# Patient Record
Sex: Female | Born: 1965 | Race: White | Hispanic: No | Marital: Married | Smoking: Light tobacco smoker
Health system: Southern US, Community
[De-identification: ages and names within clinical notes are randomized; demographics above are authoritative.]

## PROBLEM LIST (undated history)

## (undated) DIAGNOSIS — M5030 Other cervical disc degeneration, unspecified cervical region: Secondary | ICD-10-CM

## (undated) DIAGNOSIS — T7840XA Allergy, unspecified, initial encounter: Secondary | ICD-10-CM

## (undated) DIAGNOSIS — E28319 Asymptomatic premature menopause: Secondary | ICD-10-CM

## (undated) HISTORY — PX: ABLATION: SHX5711

## (undated) HISTORY — PX: TUBAL LIGATION: SHX77

## (undated) HISTORY — DX: Asymptomatic premature menopause: E28.319

## (undated) HISTORY — DX: Other cervical disc degeneration, unspecified cervical region: M50.30

## (undated) HISTORY — PX: CERVICAL DISC SURGERY: SHX588

## (undated) HISTORY — PX: LUMBAR DISC SURGERY: SHX700

---

## 1998-05-07 ENCOUNTER — Other Ambulatory Visit: Admission: RE | Admit: 1998-05-07 | Discharge: 1998-05-07 | Payer: Self-pay | Admitting: Obstetrics and Gynecology

## 1998-07-22 ENCOUNTER — Inpatient Hospital Stay (HOSPITAL_COMMUNITY): Admission: AD | Admit: 1998-07-22 | Discharge: 1998-07-22 | Payer: Self-pay | Admitting: Obstetrics and Gynecology

## 1998-07-25 ENCOUNTER — Inpatient Hospital Stay (HOSPITAL_COMMUNITY): Admission: AD | Admit: 1998-07-25 | Discharge: 1998-07-25 | Payer: Self-pay | Admitting: Obstetrics and Gynecology

## 1998-10-07 ENCOUNTER — Inpatient Hospital Stay (HOSPITAL_COMMUNITY): Admission: AD | Admit: 1998-10-07 | Discharge: 1998-10-09 | Payer: Self-pay | Admitting: Obstetrics and Gynecology

## 1998-10-22 ENCOUNTER — Observation Stay (HOSPITAL_COMMUNITY): Admission: AD | Admit: 1998-10-22 | Discharge: 1998-10-23 | Payer: Self-pay | Admitting: Obstetrics and Gynecology

## 1998-11-02 ENCOUNTER — Inpatient Hospital Stay (HOSPITAL_COMMUNITY): Admission: AD | Admit: 1998-11-02 | Discharge: 1998-11-04 | Payer: Self-pay | Admitting: Obstetrics and Gynecology

## 1998-12-16 ENCOUNTER — Other Ambulatory Visit: Admission: RE | Admit: 1998-12-16 | Discharge: 1998-12-16 | Payer: Self-pay | Admitting: Obstetrics and Gynecology

## 1999-12-24 ENCOUNTER — Other Ambulatory Visit: Admission: RE | Admit: 1999-12-24 | Discharge: 1999-12-24 | Payer: Self-pay | Admitting: Obstetrics and Gynecology

## 2000-06-18 ENCOUNTER — Inpatient Hospital Stay (HOSPITAL_COMMUNITY): Admission: AD | Admit: 2000-06-18 | Discharge: 2000-06-18 | Payer: Self-pay | Admitting: *Deleted

## 2000-08-12 ENCOUNTER — Inpatient Hospital Stay (HOSPITAL_COMMUNITY): Admission: AD | Admit: 2000-08-12 | Discharge: 2000-08-15 | Payer: Self-pay | Admitting: Obstetrics and Gynecology

## 2000-08-16 ENCOUNTER — Encounter: Admission: RE | Admit: 2000-08-16 | Discharge: 2000-09-27 | Payer: Self-pay | Admitting: Obstetrics and Gynecology

## 2000-09-27 ENCOUNTER — Other Ambulatory Visit: Admission: RE | Admit: 2000-09-27 | Discharge: 2000-09-27 | Payer: Self-pay | Admitting: Obstetrics and Gynecology

## 2001-10-17 ENCOUNTER — Other Ambulatory Visit: Admission: RE | Admit: 2001-10-17 | Discharge: 2001-10-17 | Payer: Self-pay | Admitting: Obstetrics and Gynecology

## 2002-12-11 ENCOUNTER — Other Ambulatory Visit: Admission: RE | Admit: 2002-12-11 | Discharge: 2002-12-11 | Payer: Self-pay | Admitting: Obstetrics and Gynecology

## 2004-02-18 ENCOUNTER — Other Ambulatory Visit: Admission: RE | Admit: 2004-02-18 | Discharge: 2004-02-18 | Payer: Self-pay | Admitting: Obstetrics and Gynecology

## 2005-04-27 ENCOUNTER — Other Ambulatory Visit: Admission: RE | Admit: 2005-04-27 | Discharge: 2005-04-27 | Payer: Self-pay | Admitting: Obstetrics and Gynecology

## 2006-02-24 ENCOUNTER — Ambulatory Visit: Payer: Self-pay | Admitting: Orthopedic Surgery

## 2006-03-05 ENCOUNTER — Encounter: Admission: RE | Admit: 2006-03-05 | Discharge: 2006-03-05 | Payer: Self-pay | Admitting: Orthopedic Surgery

## 2006-03-17 ENCOUNTER — Ambulatory Visit: Payer: Self-pay | Admitting: Orthopedic Surgery

## 2006-04-28 ENCOUNTER — Ambulatory Visit (HOSPITAL_COMMUNITY): Admission: RE | Admit: 2006-04-28 | Discharge: 2006-04-29 | Payer: Self-pay | Admitting: Neurosurgery

## 2006-11-20 ENCOUNTER — Encounter: Admission: RE | Admit: 2006-11-20 | Discharge: 2006-11-20 | Payer: Self-pay | Admitting: Neurosurgery

## 2007-09-27 DIAGNOSIS — H902 Conductive hearing loss, unspecified: Secondary | ICD-10-CM | POA: Insufficient documentation

## 2007-10-11 ENCOUNTER — Emergency Department (HOSPITAL_COMMUNITY): Admission: EM | Admit: 2007-10-11 | Discharge: 2007-10-12 | Payer: Self-pay | Admitting: Emergency Medicine

## 2008-04-16 ENCOUNTER — Ambulatory Visit (HOSPITAL_COMMUNITY): Admission: RE | Admit: 2008-04-16 | Discharge: 2008-04-17 | Payer: Self-pay | Admitting: Neurosurgery

## 2008-09-04 ENCOUNTER — Ambulatory Visit (HOSPITAL_COMMUNITY): Admission: RE | Admit: 2008-09-04 | Discharge: 2008-09-04 | Payer: Self-pay | Admitting: Neurosurgery

## 2009-06-06 ENCOUNTER — Ambulatory Visit (HOSPITAL_COMMUNITY): Admission: RE | Admit: 2009-06-06 | Discharge: 2009-06-06 | Payer: Self-pay | Admitting: Obstetrics and Gynecology

## 2011-02-21 LAB — PREGNANCY, URINE: Preg Test, Ur: NEGATIVE

## 2011-02-21 LAB — CBC
HCT: 34.5 % — ABNORMAL LOW (ref 36.0–46.0)
RBC: 3.69 MIL/uL — ABNORMAL LOW (ref 3.87–5.11)
WBC: 6.1 10*3/uL (ref 4.0–10.5)

## 2011-03-30 NOTE — Op Note (Signed)
NAMETAJAE, MAIOLO                ACCOUNT NO.:  [id number]   MEDICAL RECORD NO.:  [id number]          PATIENT TYPE:  OIB   LOCATION:  3536                         FACILITY:  [facility]   PHYSICIAN:  Danae Orleans. Venetia Maxon, M.D.  DATE OF BIRTH:  Oct 03, 1966   DATE OF PROCEDURE:  04/16/2008  DATE OF DISCHARGE:                               OPERATIVE REPORT   PREOPERATIVE DIAGNOSES:  Herniated cervical disk, C6-C7, with  degenerative disk disease, spondylosis, and radiculopathy.   POSTOPERATIVE DIAGNOSES:  Herniated cervical disk, C6-C7, with  degenerative disk disease, spondylosis, and radiculopathy.   PROCEDURE:  Anterior cervical decompression and fusion, C6-C7 with  allograft, bone graft, morselized bone autograft, and anterior cervical  plate.   SURGEON:  Dorian Heckle, MD   ASSISTANTS:  Cristi Loron, MD and Georgiann Cocker, RN   ANESTHESIA:  General endotracheal anesthesia.   ESTIMATED BLOOD LOSS:  Minimal.   COMPLICATIONS:  None.   DISPOSITION:  Recovery.   INDICATIONS:  Lori [name] is a 45 year old woman who previously had  undergone anterior cervical decompression and fusion at C7-T1 level.  She developed cervical disk degeneration and disk herniation C6-C7.  It  was elected to take her to the surgery for anterior cervical  decompression and fusion at this affected level.   PROCEDURE IN DETAIL:  Lori [name] was brought to the operating room.  Following satisfactory and uncomplicated induction of general  endotracheal anesthesia and placement of intravenous lines, the patient  was placed in the supine position on the operating table.  Her neck was  placed in slight extension.  She was placed in 5 pounds of Halter  traction.  Her anterior neck was then prepped and draped in usual  sterile fashion.  The area of planned incision was re-infiltrated with  0.25% Marcaine, 0.5% lidocaine with 1:200,000 epinephrine.  Incision was  made from the midline to the anterior border of  sternocleidomastoid  muscle and carried sharply through platysma layer on the left side of  midline.  Sub platysmal dissection was performed exposing the anterior  cervical spine.  Previously placed plate at the Z6-X0 was identified and  the C6-C7 interspace was identified.  The longus colli muscles was taken  down from the anterior cervical spine from C6-C7 bilaterally using  electrocautery and Key elevator.  Self-retaining shadow line retractor  was placed to facilitate exposure.  A thorough diskectomy was performed  at C6-C7 and endplates were decorticated with high-speed drill and bone  removed through these maneuvers was saved for later use in bone  grafting.  The posterior longitudinal ligament was then removed in  piecemeal fashion decompressing a cervical spinal cord dura both C7  nerve roots, particularly on the left where a large amount of disk  material was removed with decompression of the C7 nerve root widely as  they extended out of the neural foramen.  Hemostasis was assured with  Gelfoam-soaked thrombin.  After trial sizing, a small Alphatec bone  allograft wedge 7 mm in thickness was then fashioned with a high-speed  drill, packed with morselized bone autograft, inserted in the  interspace, and countersunk appropriately.  Using Trestle anterior  cervical plate, this was abutted against the previously placed plate and  variable angle of 40 mm screws were placed, 2 at C7 and 2 at C6.  All  screws had excellent purchase and locking mechanisms were engaged.  Final x-ray demonstrated well-positioned interbody graft in the anterior  cervical plate.  The wound was irrigated.  Soft tissues were inspected  and found to be good repair.  Hemostasis was assured.  The platysma  layer was closed with 3-0 Vicryl sutures and skin edges were  approximated 3-0 Vicryl subcuticular stitch.  The wound was dressed with  Dermabond.  The patient was extubated in the operating room and taken to   recovery in stable satisfactory condition.  He tolerated the operation  well.  Counts were correct at the end of the case.      Danae Orleans. Venetia Maxon, M.D.  Electronically Signed     JDS/MEDQ  D:  04/16/2008  T:  04/17/2008  Job:  [id number]

## 2011-03-30 NOTE — H&P (Signed)
Lori [name], Lori [name]                ACCOUNT NO.:  [id number]   MEDICAL RECORD NO.:  [id number]          PATIENT TYPE:  AMB   LOCATION:  SDC                           [facility]   PHYSICIAN:  Duke Salvia. Marcelle Overlie, M.D.DATE OF BIRTH:  [birth year]   DATE OF ADMISSION:  DATE OF DISCHARGE:                              HISTORY & PHYSICAL   CHIEF COMPLAINT:  For tubal ligation.   HISTORY OF PRESENT ILLNESS:  A 46 year old G5, P3 has an 53, 83 and 54-  year-old who has been using oral contraceptives presented to the office  this week for attempted Essure procedure.  The insert could not be  inserted into either tube due to either tubal spasm or blockage.  She is  sure she would not want to be pregnant again under any circumstances and  opts for Filshie clip tubal.  This procedure including risks related to  bleeding, infection, other complications that may require additional or  open surgery discussed.  The permanence of the procedure, failure rate  of two to three per thousand all reviewed with her which she understands  and accepts.  She was advised to continue using birth control pills one  additional month beyond today.   PAST MEDICAL HISTORY:  Allergies none.   CURRENT MEDICATIONS:  Cymbalta and oral contraceptives.   SURGICAL HISTORY:  Three vaginal deliveries.  She has had two cervical  diskectomies and a D and C in 1998.   SOCIAL HISTORY:  Denies alcohol, tobacco and drug use.  She is married.  Dr. Herb Grays is her medical doctor.   PHYSICAL EXAMINATION:  Temperature 93, blood pressure 120/78.  HEENT: Unremarkable.  NECK:  Supple without masses.  LUNGS:  Clear.  CARDIOVASCULAR:  Regular rate and rhythm without murmurs, rubs, gallops  noted.  BREASTS:  Without masses.  ABDOMEN:  Soft, flat, nontender.  PELVIC:  Normal external genitalia.  Vagina and cervix are clear.  Uterus mid positional, normal size.  Adnexa negative.  EXTREMITIES:  Unremarkable.  NEUROLOGIC:   Unremarkable.   IMPRESSION:  Failed Essure tubal ligation.   PLAN:  Filshie clip tubal ligation procedure and risks reviewed as  above.      Richard M. Marcelle Overlie, M.D.  Electronically Signed     RMH/MEDQ  D:  06/06/2009  T:  06/06/2009  Job:  [id number]

## 2011-03-30 NOTE — Op Note (Signed)
NAMEJOZI, Lori [name]                ACCOUNT NO.:  [id number]   MEDICAL RECORD NO.:  [id number]          PATIENT TYPE:  AMB   LOCATION:  SDC                           [facility]   PHYSICIAN:  Duke Salvia. Marcelle Overlie, M.D.DATE OF BIRTH:  1966/05/28   DATE OF PROCEDURE:  06/06/2009  DATE OF DISCHARGE:  06/06/2009                               OPERATIVE REPORT   PREOPERATIVE DIAGNOSIS:  Requests permanent sterilization.   POSTOPERATIVE DIAGNOSIS:  Requests permanent sterilization.   PROCEDURE:  Laparoscopic tubal with Filshie clips.   SURGEON:  Duke Salvia. Marcelle Overlie, MD   ANESTHESIA:  General.   COMPLICATIONS:  None.   DRAINS:  In and out Foley catheter.   BLOOD LOSS:  Minimal.   COMPLICATIONS:  None.   PROCEDURE AND FINDINGS:  The patient was taken to the room after an  adequate level of general endotracheal anesthesia was obtained with the  patient's legs in stirrups.  The abdomen, perineum, and vagina were  prepped and draped in the usual manner for laparoscopy.  The bladder was  drained.  The EUA carried out, uterus was mid to posterior, normal size,  adnexa negative.  Hulka tenaculum was positioned.  Attention directed to  the abdomen.  The subumbilical area was infiltrated with 0.25% Marcaine  plain.  A small incision was made, the Veress needle was introduced  without difficulty.  Its intra-abdominal position verified by pressure  and water testing.  After 2.5 L of pneumoperitoneum was then created,  laparoscopic trocar and sleeve were then introduced without difficulty.  The patient was placed in Trendelenburg.  The pelvic findings as  follows.   Uterus itself was mid-to-posterior in position, but on elevation, it was  normal.  The cul-de-sac and anterior space were free and clear.  Both  tubes and ovaries were otherwise unremarkable.  A 5 mL of 4% Marcaine  was then dripped across each tube and the cornu to the fimbriated and  the Filshie clip applicator was then back  loaded.  The right tube was  identified and traced from cornu to the fimbriated end, regressed 2-3 cm  from the cornu with a Filshie clip applied at the right angle completely  engulfing the tube.  This showed excellent application.  The exact same  repeated on the left after carefully identifying the tube from cornu to  the fimbriated end.  The area was then video documented.  Instruments  were removed, gas allowed to escape.  Defects were closed with 4 Dexon  subcuticular suture.  She tolerated this well, went to recovery room in  good condition.      Richard M. Marcelle Overlie, M.D.  Electronically Signed     RMH/MEDQ  D:  06/06/2009  T:  06/07/2009  Job:  [id number]

## 2011-04-02 NOTE — Op Note (Signed)
Lori [name], [name]                ACCOUNT NO.:  [id number]   MEDICAL RECORD NO.:  [id number]          PATIENT TYPE:  AMB   LOCATION:  SDS                          FACILITY:  [facility]   PHYSICIAN:  Danae Orleans. Venetia Maxon, M.D.  DATE OF BIRTH:  1965-12-02   DATE OF PROCEDURE:  04/28/2006  DATE OF DISCHARGE:                                 OPERATIVE REPORT   PREOPERATIVE DIAGNOSIS:  Herniated cervical disc with spondylosis,  degenerative disc disease and radiculopathy, C7-T1 level.   POSTOPERATIVE DIAGNOSIS:  Herniated cervical disc with spondylosis,  degenerative disc disease and radiculopathy, C7-T1 level.   PROCEDURE:  Anterior cervical decompression and fusion, C7-T1 with PEEK  interbody cage and anterior cervical plate.   SURGEON:  Dr. Maeola Harman.   ASSISTANT:  Dr. Franky Macho.   ANESTHESIA:  General endotracheal anesthesia.   ESTIMATED BLOOD LOSS:  Minimal.   COMPLICATIONS:  None.   DISPOSITION:  Recovery room.   INDICATIONS:  Lori [name] is a 45 year old woman, with a C8 radiculopathy  on the left, with hand weakness and a herniated cervical disc at C7-T1 on  the left.  It was elected to take her to surgery for anterior cervical  decompression and fusion at this level.   PROCEDURE:  Lori [name] was brought to the operating room.  Following the  satisfactory and uncomplicated induction of general endotracheal anesthesia  and the placement of intravenous line, she was placed in a supine position  on the operating table.  Her neck was placed in slight extension.  She was  placed in 10 pounds of halter traction.  Her anterior neck was then prepped  and draped in the usual sterile fashion.  The area of planned incision was  then infiltrated with 0.25% Marcaine and 0.5% lidocaine, 1:200,000  epinephrine.  An incision was made in the midline to the anterior border of  the sternocleidomastoid muscle and carried sharply through the platysmal  layer.  Subplatysmal dissection was  performed exposing the anterior border  of the sternocleidomastoid muscle using blunt dissection across the sheath  and this kept lateral and trachea and esophagus kept medial exposing the  anterior cervical spine.  The __________ spinal needle was placed at what  was felt to be C6-7 level.  Subsequent radiograph demonstrated spinal  needles at C6, 7, and T1 levels.  Interspace at C7-T1 was incised and longus  colli muscles were taken down from the anterior cervical spine, and Shadow-  Line retractor was placed to facilitate exposure.  The interspace and then  incised and disc material was removed in piecemeal fashion.  Subsequently,  endplates were stripped of residual disc material.  The disc spreader was  placed and microscope was brought onto the field.  Under high power  microscopic visualization, the endplates were decorticated and several  fragments of herniated disc material were removed from the left foramen with  significant decompression of the left-sided spinal cord and nerve root.  Subsequently, the posterior longitudinal ligament was incised with the  __________ knife and removed in piecemeal fashion.  Both C8 nerve roots were  decompressed as they  extended up the neural foramina.  Hemostasis was  assured with Gelfoam soaked thrombin.  After a trial sizing, a 6-mm PEEK  interbody cage was selected, packed with morselized bone autograft which was  preserved for drilling the endplates and additionally with some  demineralized bone matrix putty.  This was then inserted in the interspace  and counter sunk appropriately.  A 16-mm anterior cervical plate was then  affixed to the anterior cervical spine using variable angle 14-mm screws; 2  at C7, 2 at T1.  All screws had excellent purchase.  Locking mechanisms were  engaged.  Final x-ray demonstrated well positioned interbody graft and  anterior cervical plate.  Soft tissues were inspected and found to be in  good repair.   Hemostasis was assured.  The platysmal layer was closed with 3-  0 Vicryl sutures and skin edges reapproximated with interrupted 3-0 Vicryl  subcuticular stitch.  The wound was dressed with Dermabond.  The patient was  extubated in the operating room and taken to recovery room in stable,  satisfactory condition having tolerated the operation well.  All counts  correct at the end of the case.      Danae Orleans. Venetia Maxon, M.D.     JDS/MEDQ  D:  04/28/2006  T:  04/28/2006  Job:  [id number]

## 2011-08-12 LAB — CBC
Hemoglobin: 12.7
Platelets: 229
RBC: 4.04
WBC: 5

## 2011-08-24 LAB — CBC
HCT: 35 — ABNORMAL LOW
Hemoglobin: 12.3
RBC: 3.9
RDW: 13.2
WBC: 6.4

## 2011-08-24 LAB — BASIC METABOLIC PANEL
Calcium: 8.1 — ABNORMAL LOW
Creatinine, Ser: 0.5
Sodium: 132 — ABNORMAL LOW

## 2011-08-24 LAB — DIFFERENTIAL
Basophils Relative: 0
Eosinophils Absolute: 0 — ABNORMAL LOW
Eosinophils Relative: 0
Monocytes Absolute: 0.3
Neutrophils Relative %: 83 — ABNORMAL HIGH

## 2016-05-26 ENCOUNTER — Encounter: Payer: Self-pay | Admitting: General Practice

## 2016-07-21 ENCOUNTER — Telehealth: Payer: Self-pay | Admitting: Emergency Medicine

## 2016-07-21 NOTE — Telephone Encounter (Signed)
Called pt to complete pre-visit phone call. LMOVM to return call. Patient advised on message that if unable to return call to please arrive 10-15 minutes early for appointment to update all insurance and paperwork with front desk.

## 2016-07-22 ENCOUNTER — Encounter: Payer: Self-pay | Admitting: Family Medicine

## 2016-07-22 ENCOUNTER — Ambulatory Visit (INDEPENDENT_AMBULATORY_CARE_PROVIDER_SITE_OTHER): Payer: Self-pay | Admitting: Family Medicine

## 2016-07-22 VITALS — BP 120/78 | HR 68 | Temp 98.0°F | Resp 16 | Ht 63.75 in | Wt 148.5 lb

## 2016-07-22 DIAGNOSIS — Z23 Encounter for immunization: Secondary | ICD-10-CM

## 2016-07-22 DIAGNOSIS — H6122 Impacted cerumen, left ear: Secondary | ICD-10-CM | POA: Diagnosis not present

## 2016-07-22 DIAGNOSIS — Z0001 Encounter for general adult medical examination with abnormal findings: Secondary | ICD-10-CM | POA: Diagnosis not present

## 2016-07-22 DIAGNOSIS — Z0000 Encounter for general adult medical examination without abnormal findings: Secondary | ICD-10-CM

## 2016-07-22 LAB — CBC WITH DIFFERENTIAL/PLATELET
BASOS ABS: 0 {cells}/uL (ref 0–200)
Basophils Relative: 0 %
Eosinophils Absolute: 58 cells/uL (ref 15–500)
Eosinophils Relative: 1 %
HEMATOCRIT: 39.1 % (ref 35.0–45.0)
HEMOGLOBIN: 13.2 g/dL (ref 11.7–15.5)
LYMPHS ABS: 2146 {cells}/uL (ref 850–3900)
LYMPHS PCT: 37 %
MCH: 31.4 pg (ref 27.0–33.0)
MCHC: 33.8 g/dL (ref 32.0–36.0)
MCV: 92.9 fL (ref 80.0–100.0)
MONO ABS: 522 {cells}/uL (ref 200–950)
MPV: 9.6 fL (ref 7.5–12.5)
Monocytes Relative: 9 %
NEUTROS PCT: 53 %
Neutro Abs: 3074 cells/uL (ref 1500–7800)
Platelets: 278 10*3/uL (ref 140–400)
RBC: 4.21 MIL/uL (ref 3.80–5.10)
RDW: 13.5 % (ref 11.0–15.0)
WBC: 5.8 10*3/uL (ref 3.8–10.8)

## 2016-07-22 NOTE — Progress Notes (Signed)
Pre visit review using our clinic review tool, if applicable. No additional management support is needed unless otherwise documented below in the visit note. 

## 2016-07-22 NOTE — Progress Notes (Signed)
   Subjective:    Patient ID: Lori [name], female    DOB: 07-Nov-1966, 50 y.o.   MRN: [id number]  HPI New to establish.  Previous MD- [place] (UTD on pap/mammo)  CPE- pt has never had colonoscopy and is not willing.  Pt will think about cologuard as an alternative.  Wants flu shot today.  Due for Tdap- pt defers today.   Review of Systems Patient reports no vision changes, adenopathy,fever, weight change,  persistant/recurrent hoarseness , swallowing issues, chest pain, palpitations, edema, persistant/recurrent cough, hemoptysis, dyspnea (rest/exertional/paroxysmal nocturnal), gastrointestinal bleeding (melena, rectal bleeding), abdominal pain, significant heartburn, bowel changes, GU symptoms (dysuria, hematuria, incontinence), Gyn symptoms (abnormal  bleeding, pain),  syncope, focal weakness, memory loss, numbness & tingling, skin/hair/nail changes, abnormal bruising or bleeding, anxiety, or depression.   + decreased hearing in L ear- pt is having to ask people to repeat themselves.  Hx of excessive wax in ears    Objective:   Physical Exam General Appearance:    Alert, cooperative, no distress, appears stated age  Head:    Normocephalic, without obvious abnormality, atraumatic  Eyes:    PERRL, conjunctiva/corneas clear, EOM's intact, fundi    benign, both eyes  Ears:    Normal TM's after wax removal in L ear  Nose:   Nares normal, septum midline, mucosa normal, no drainage    or sinus tenderness  Throat:   Lips, mucosa, and tongue normal; teeth and gums normal  Neck:   Supple, symmetrical, trachea midline, no adenopathy;    Thyroid: no enlargement/tenderness/nodules  Back:     Symmetric, no curvature, ROM normal, no CVA tenderness  Lungs:     Clear to auscultation bilaterally, respirations unlabored  Chest Wall:    No tenderness or deformity   Heart:    Regular rate and rhythm, S1 and S2 normal, no murmur, rub   or gallop  Breast Exam:    Deferred to GYN  Abdomen:      Soft, non-tender, bowel sounds active all four quadrants,    no masses, no organomegaly  Genitalia:    Deferred to GYN  Rectal:    Extremities:   Extremities normal, atraumatic, no cyanosis or edema  Pulses:   2+ and symmetric all extremities  Skin:   Skin color, texture, turgor normal, no rashes or lesions  Lymph nodes:   Cervical, supraclavicular, and axillary nodes normal  Neurologic:   CNII-XII intact, normal strength, sensation and reflexes    throughout          Assessment & Plan:  PE- pt's PE WNL w/ exception of cerumen impaction on L.  Wax successfully removed w/ curette and hearing improved immediately.  UTD on GYN.  Pt is due for colonoscopy- refuses at this time.  Flu shot given today.  Due for Tdap- pt will return for this.  Check labs.  Anticipatory guidance provided.

## 2016-07-22 NOTE — Patient Instructions (Signed)
Follow up in 1 year or as needed We'll notify you of your lab results and make any changes if needed Continue to work on healthy diet and regular exercise- you look great! Please think about the Cologuard and let me know! You are due for your Tdap- we can do this when your daughter comes back for shots Call with any questions or concerns Welcome!  We're glad to have you!!!

## 2016-07-23 ENCOUNTER — Encounter: Payer: Self-pay | Admitting: General Practice

## 2016-07-23 LAB — LIPID PANEL
CHOL/HDL RATIO: 2.6 ratio (ref <=5.0)
CHOLESTEROL: 213 mg/dL — AB (ref 125–200)
HDL: 81 mg/dL (ref >=46)
LDL Cholesterol: 105 mg/dL (ref <130)
TRIGLYCERIDES: 135 mg/dL (ref <150)
VLDL: 27 mg/dL (ref <30)

## 2016-07-23 LAB — HEPATIC FUNCTION PANEL
ALK PHOS: 64 U/L (ref 33–130)
ALT: 16 U/L (ref 6–29)
AST: 16 U/L (ref 10–35)
Albumin: 4.1 g/dL (ref 3.6–5.1)
BILIRUBIN DIRECT: 0.1 mg/dL (ref <=0.2)
BILIRUBIN TOTAL: 0.4 mg/dL (ref 0.2–1.2)
Indirect Bilirubin: 0.3 mg/dL (ref 0.2–1.2)
Total Protein: 7 g/dL (ref 6.1–8.1)

## 2016-07-23 LAB — BASIC METABOLIC PANEL
BUN: 15 mg/dL (ref 7–25)
CALCIUM: 9.2 mg/dL (ref 8.6–10.4)
CO2: 24 mmol/L (ref 20–31)
Chloride: 101 mmol/L (ref 98–110)
Creat: 0.82 mg/dL (ref 0.50–1.05)
Glucose, Bld: 85 mg/dL (ref 65–99)
POTASSIUM: 3.8 mmol/L (ref 3.5–5.3)
SODIUM: 138 mmol/L (ref 135–146)

## 2016-07-23 LAB — TSH: TSH: 1.18 mIU/L

## 2016-07-23 LAB — VITAMIN D 25 HYDROXY (VIT D DEFICIENCY, FRACTURES): Vit D, 25-Hydroxy: 30 ng/mL (ref 30–100)

## 2017-02-15 ENCOUNTER — Encounter: Payer: Self-pay | Admitting: Physician Assistant

## 2017-02-15 ENCOUNTER — Ambulatory Visit (INDEPENDENT_AMBULATORY_CARE_PROVIDER_SITE_OTHER): Payer: BC Managed Care – PPO | Admitting: Physician Assistant

## 2017-02-15 VITALS — BP 102/60 | HR 70 | Temp 98.1°F | Resp 14 | Ht 64.0 in | Wt 141.0 lb

## 2017-02-15 DIAGNOSIS — J208 Acute bronchitis due to other specified organisms: Secondary | ICD-10-CM | POA: Diagnosis not present

## 2017-02-15 DIAGNOSIS — B9689 Other specified bacterial agents as the cause of diseases classified elsewhere: Secondary | ICD-10-CM

## 2017-02-15 MED ORDER — DOXYCYCLINE HYCLATE 100 MG PO CAPS
100.0000 mg | ORAL_CAPSULE | Freq: Two times a day (BID) | ORAL | 0 refills | Status: DC
Start: 2017-02-15 — End: 2017-09-07

## 2017-02-15 MED ORDER — BENZONATATE 100 MG PO CAPS: 100.0000 mg | ORAL_CAPSULE | Freq: Two times a day (BID) | ORAL | 0 refills | Status: DC | PRN

## 2017-02-15 NOTE — Patient Instructions (Signed)
Take antibiotic (Doxycycline) as directed.  Increase fluids.  Get plenty of rest. Tessalon as directed for cough. Take a daily probiotic (I recommend Align or Culturelle, but even Activia Yogurt may be beneficial).  A humidifier placed in the bedroom may offer some relief for a dry, scratchy throat of nasal irritation.  Read information below on acute bronchitis. Please call or return to clinic if symptoms are not improving.  Acute Bronchitis Bronchitis is when the airways that extend from the windpipe into the lungs get red, puffy, and painful (inflamed). Bronchitis often causes thick spit (mucus) to develop. This leads to a cough. A cough is the most common symptom of bronchitis. In acute bronchitis, the condition usually begins suddenly and goes away over time (usually in 2 weeks). Smoking, allergies, and asthma can make bronchitis worse. Repeated episodes of bronchitis may cause more lung problems.  HOME CARE  Rest.  Drink enough fluids to keep your pee (urine) clear or pale yellow (unless you need to limit fluids as told by your doctor).  Only take over-the-counter or prescription medicines as told by your doctor.  Avoid smoking and secondhand smoke. These can make bronchitis worse. If you are a smoker, think about using nicotine gum or skin patches. Quitting smoking will help your lungs heal faster.  Reduce the chance of getting bronchitis again by:  Washing your hands often.  Avoiding people with cold symptoms.  Trying not to touch your hands to your mouth, nose, or eyes.  Follow up with your doctor as told.  GET HELP IF: Your symptoms do not improve after 1 week of treatment. Symptoms include:  Cough.  Fever.  Coughing up thick spit.  Body aches.  Chest congestion.  Chills.  Shortness of breath.  Sore throat.  GET HELP RIGHT AWAY IF:   You have an increased fever.  You have chills.  You have severe shortness of breath.  You have bloody thick spit  (sputum).  You throw up (vomit) often.  You lose too much body fluid (dehydration).  You have a severe headache.  You faint.  MAKE SURE YOU:   Understand these instructions.  Will watch your condition.  Will get help right away if you are not doing well or get worse. Document Released: 04/19/2008 Document Revised: 07/04/2013 Document Reviewed: 04/24/2013 [facility] Patient Information 2015 [place], [place]. This information is not intended to replace advice given to you by your health care provider. Make sure you discuss any questions you have with your health care provider.

## 2017-02-15 NOTE — Progress Notes (Signed)
Pre visit review using our clinic review tool, if applicable. No additional management support is needed unless otherwise documented below in the visit note. 

## 2017-02-15 NOTE — Progress Notes (Signed)
   Patient presents to clinic today c/o 5 days of worsening rhinorrhea, chest congestion with productive cough, sinus pressure and ear pressure. Denies fever. Sputum has changed from clear to yellow-green. Denies chest pain or SOB. Occasional chest tightness. Denies recent travel or sick contact.  History reviewed. No pertinent past medical history.  Current Outpatient Prescriptions on File Prior to Visit  Medication Sig Dispense Refill  . DULoxetine (CYMBALTA) 30 MG capsule Take 30 mg by mouth daily.    Marland Kitchen estradiol (ESTRACE) 1 MG tablet Take 1 mg by mouth daily.  11  . progesterone (PROMETRIUM) 100 MG capsule Take 100 mg by mouth daily.     No current facility-administered medications on file prior to visit.    No Known Allergies  Family History  Problem Relation Age of Onset  . Cancer Mother     skin  . Cancer Maternal Grandmother     breast    Social History   Social History  . Marital status: Married    Spouse name: N/A  . Number of children: N/A  . Years of education: N/A   Social History Main Topics  . Smoking status: Never Smoker  . Smokeless tobacco: Never Used  . Alcohol use None  . Drug use: Unknown  . Sexual activity: Yes   Other Topics Concern  . None   Social History Narrative  . None   Review of Systems - See HPI.  All other ROS are negative.  BP 102/60   Pulse 70   Temp 98.1 F (36.7 C) (Oral)   Resp 14   Ht 5\' 4"  (1.626 m)   Wt 141 lb (64 kg)   SpO2 98%   BMI 24.20 kg/m   Physical Exam  Constitutional: She is oriented to person, place, and time and well-developed, well-nourished, and in no distress.  HENT:  Head: Normocephalic and atraumatic.  Right Ear: External ear normal.  Left Ear: External ear normal.  Nose: Nose normal.  Mouth/Throat: Oropharynx is clear and moist. No oropharyngeal exudate.  TM within normal limits bilaterally.  Eyes: Conjunctivae are normal.  Neck: Neck supple.  Cardiovascular: Normal rate, regular rhythm,  normal heart sounds and intact distal pulses.   Pulmonary/Chest: Effort normal and breath sounds normal. No respiratory distress. She has no wheezes. She has no rales. She exhibits no tenderness.  Lymphadenopathy:    She has no cervical adenopathy.  Neurological: She is alert and oriented to person, place, and time.  Skin: Skin is warm and dry. No rash noted.  Psychiatric: Affect normal.  Vitals reviewed.  Assessment/Plan: 1. Acute bacterial bronchitis Rx Doxycycline.  Increase fluids.  Rest.  Saline nasal spray.  Probiotic.  Tessalon per orders.  Call or return to clinic if symptoms are not improving.  - doxycycline (VIBRAMYCIN) 100 MG capsule; Take 1 capsule (100 mg total) by mouth 2 (two) times daily.  Dispense: 14 capsule; Refill: 0 - benzonatate (TESSALON) 100 MG capsule; Take 1 capsule (100 mg total) by mouth 2 (two) times daily as needed for cough.  Dispense: 20 capsule; Refill: 0   Leeanne Rio, [place]

## 2017-09-07 ENCOUNTER — Ambulatory Visit (INDEPENDENT_AMBULATORY_CARE_PROVIDER_SITE_OTHER): Payer: BC Managed Care – PPO | Admitting: Physician Assistant

## 2017-09-07 ENCOUNTER — Encounter: Payer: Self-pay | Admitting: Physician Assistant

## 2017-09-07 VITALS — BP 130/80 | HR 62 | Temp 98.4°F | Resp 14 | Ht 64.0 in | Wt 143.0 lb

## 2017-09-07 DIAGNOSIS — R030 Elevated blood-pressure reading, without diagnosis of hypertension: Secondary | ICD-10-CM | POA: Diagnosis not present

## 2017-09-07 DIAGNOSIS — R0789 Other chest pain: Secondary | ICD-10-CM

## 2017-09-07 LAB — TROPONIN I: TNIDX: 0.01 ug/l (ref 0.00–0.06)

## 2017-09-07 NOTE — Progress Notes (Signed)
Cardiology Office Note   Date:  09/08/2017   ID:  Chardonnay, Holzmann September 22, 1966, MRN [id number]  PCP:  Midge Minium, MD  Cardiologist:   Minus Breeding, MD    Chief Complaint  Patient presents with  . Chest Pain     History of Present Illness: Lori [name] is a 51 y.o. female who is referred by Midge Minium, MD for evaluation of chest pain.  She also had an episode of hypertension yesterday when she saw her neurosurgeon. Her blood pressure was 170/102. She was referred to Midge Minium, MD  She says she never has blood pressure issues. She doesn't describe chest pain. This has been ongoing for some time. She described sharp left upper chest discomfort or aching. The aching might last for hours. She cannot bring it on with activity. She is limited by back pain she does feel some routine exercising now bringing this on.  It does not get worse with activity. It can be 7 out of 10 in intensity. She has chronic left arm tingling. She does not describe jaw discomfort. She does not have associated nausea vomiting or diaphoresis. She does not have shortness of breath, PND or orthopnea. She's never had any cardiovascular testing or workup.  Of note she did have a troponin drawn in the office yesterday that was negative.   Past Medical History:  Diagnosis Date  . DDD (degenerative disc disease), cervical   . Early menopause     Past Surgical History:  Procedure Laterality Date  . ABLATION  3 years ago   Endometrial   . CERVICAL DISC SURGERY     x 2  . LUMBAR DISC SURGERY    . TUBAL LIGATION       Current Outpatient Prescriptions  Medication Sig Dispense Refill  . DULoxetine (CYMBALTA) 30 MG capsule Take 30 mg by mouth daily.    Marland Kitchen estradiol (ESTRACE) 1 MG tablet Take 1 mg by mouth daily.  11  . progesterone (PROMETRIUM) 100 MG capsule Take 100 mg by mouth daily.    Marland Kitchen tiZANidine (ZANAFLEX) 2 MG tablet TAKE 1 TABLET BY MOUTH EVERY 6-8 HOURS AS NEEDED FOR SPASM   0   No current facility-administered medications for this visit.     Allergies:   Hydrocodone-acetaminophen    Social History:  The patient  reports that she has never smoked. She has never used smokeless tobacco. She reports that she drinks about 1.8 oz of alcohol per week .   Family History:  The patient's family history includes Alcohol abuse in her father and sister; Cancer in her maternal grandmother and mother.    ROS:  Please see the history of present illness.   Otherwise, review of systems are positive for none.   All other systems are reviewed and negative.    PHYSICAL EXAM: VS:  BP 114/70 (BP Location: Left Arm)   Pulse 63   Ht 5\' 3"  (1.6 m)   Wt 142 lb 9.6 oz (64.7 kg)   SpO2 99%   BMI 25.26 kg/m  , BMI Body mass index is 25.26 kg/m. GENERAL:  Well appearing HEENT:  Pupils equal round and reactive, fundi not visualized, oral mucosa unremarkable NECK:  No jugular venous distention, waveform within normal limits, carotid upstroke brisk and symmetric, no bruits, no thyromegaly LYMPHATICS:  No cervical, inguinal adenopathy LUNGS:  Clear to auscultation bilaterally BACK:  No CVA tenderness CHEST:  Unremarkable HEART:  PMI not displaced or sustained,S1  and S2 within normal limits, no S3, no S4, no clicks, no rubs, no murmurs ABD:  Flat, positive bowel sounds normal in frequency in pitch, no bruits, no rebound, no guarding, no midline pulsatile mass, no hepatomegaly, no splenomegaly EXT:  2 plus pulses throughout, no edema, no cyanosis no clubbing SKIN:  No rashes no nodules NEURO:  Cranial nerves II through XII grossly intact, motor grossly intact throughout PSYCH:  Cognitively intact, oriented to person place and time    EKG:  EKG is not ordered today. The ekg ordered yesterday demonstrates regular rhythm rate 59 with unusual P wave axis.  No acute ST T wave changes.     Recent Labs: No results found for requested labs within last 8760 hours.    Lipid Panel      Component Value Date/Time   CHOL 213 (H) 07/22/2016 1532   TRIG 135 07/22/2016 1532   HDL 81 07/22/2016 1532   CHOLHDL 2.6 07/22/2016 1532   VLDL 27 07/22/2016 1532   LDLCALC 105 07/22/2016 1532      Wt Readings from Last 3 Encounters:  09/08/17 142 lb 9.6 oz (64.7 kg)  09/07/17 143 lb (64.9 kg)  02/15/17 141 lb (64 kg)      Other studies Reviewed: Additional studies/ records that were reviewed today include: Labs.  . Review of the above records demonstrates:  Please see elsewhere in the note.     ASSESSMENT AND PLAN:   CHEST PAIN:  I think her chest pain is somewhat atypical. However, risk stratification screening with empiric calcium score is reasonable. She and I discussed this.  HTN:  Her blood pressure was elevated 1 signed but otherwise is not. 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 Otherwise no change in therapy is indicated.  Current medicines are reviewed at length with the patient today.  The patient does not have concerns regarding medicines.  The following changes have been made:  no change  Labs/ tests ordered today include:   Orders Placed This Encounter  Procedures  . CT CARDIAC SCORING     Disposition:   FU with me as needed.      Signed, Minus Breeding, MD  09/08/2017 10:33 AM    [facility]

## 2017-09-07 NOTE — Progress Notes (Signed)
Pre visit review using our clinic review tool, if applicable. No additional management support is needed unless otherwise documented below in the visit note. 

## 2017-09-07 NOTE — Patient Instructions (Signed)
I think the elevated BP this morning was a combination of stress/anxiety and pain. BP looks much better in our office.  You are very active and have no pain during exercise which makes symptoms unlikely to be cardiac. Again EKG looked good today. I have checked blood work today to assess cardiac markers to give further reassurance that symptoms seem anxiety related.  Take your Tizanidine when you get home to help with back pain. Hydrate and rest.   If anything acutely worsens or you note new symptoms, please have someone carry you to the ER for further assessment.  I will schedule follow-up when we call with results.

## 2017-09-07 NOTE — Progress Notes (Signed)
Patient with history of chronic lumbago and radiculopathy presents to clinic today for assessment of elevated BP noted at her Neurosurgery appointment this morning. States she was in for a routine follow-up with Neurosurgery this morning. Initial BP measurement noted to be elevated at 170/102. Patient states BP was checked manually at beginning of visit after being roomed by Logan. States BP was not rechecked at any point during the visit. Was told to follow-up with her PCP for further assessment. Patient denies history of hypertension. Has noted some intermittent chest pain over the past few weeks, described as midsternal, non-radiating and aching in nature. Lasting for 10 minutes or more. Is not exacerbated with exertion or alleviated with rest. States she is doing a high-intensity interval training that is a combination of cardio and resistance training. Is doing this several days per week without any chest pain, palpitations, SOB, lightheadedness or dizziness. Denies heart burn or indigestion. Some occasional worsening of pain with ROM.   History reviewed. No pertinent past medical history.  Current Outpatient Prescriptions on File Prior to Visit  Medication Sig Dispense Refill  . DULoxetine (CYMBALTA) 30 MG capsule Take 30 mg by mouth daily.    Marland Kitchen estradiol (ESTRACE) 1 MG tablet Take 1 mg by mouth daily.  11  . progesterone (PROMETRIUM) 100 MG capsule Take 100 mg by mouth daily.    Marland Kitchen tiZANidine (ZANAFLEX) 2 MG tablet TAKE 1 TABLET BY MOUTH EVERY 6-8 HOURS AS NEEDED FOR SPASM  0   No current facility-administered medications on file prior to visit.     Allergies  Allergen Reactions  . Hydrocodone-Acetaminophen Itching    Family History  Problem Relation Age of Onset  . Cancer Mother        skin  . Cancer Maternal Grandmother        breast    Social History   Social History  . Marital status: Married    Spouse name: N/A  . Number of children: N/A  . Years of education: N/A    Social History Main Topics  . Smoking status: Never Smoker  . Smokeless tobacco: Never Used  . Alcohol use None  . Drug use: Unknown  . Sexual activity: Yes   Other Topics Concern  . None   Social History Narrative  . None   Review of Systems - See HPI.  All other ROS are negative.  BP 130/80 (BP Location: Right Arm, Cuff Size: Normal)   Pulse 62   Temp 98.4 F (36.9 C) (Oral)   Resp 14   Ht 5\' 4"  (1.626 m)   Wt 143 lb (64.9 kg)   SpO2 98%   BMI 24.55 kg/m    Physical Exam  Constitutional: She is oriented to person, place, and time and well-developed, well-nourished, and in no distress.  HENT:  Head: Normocephalic and atraumatic.  Eyes: Conjunctivae are normal.  Neck: Neck supple.  Cardiovascular: Normal rate, regular rhythm, normal heart sounds and intact distal pulses.   Pulmonary/Chest: Effort normal and breath sounds normal. No respiratory distress. She has no wheezes. She has no rales. She exhibits no tenderness.  Musculoskeletal:       Right shoulder: Normal.       Left shoulder: Normal.       Lumbar back: She exhibits tenderness, pain and spasm. She exhibits normal range of motion and no bony tenderness.  Lymphadenopathy:    She has no cervical adenopathy.  Neurological: She is alert and oriented to person, place, and time.  Skin: Skin is warm and dry. No rash noted.  Psychiatric: Affect normal.  Vitals reviewed.  Assessment/Plan: 1. Atypical chest pain Sternal. Some tenderness with palpation but mildly so. Patient involved in high-intensity training which could be contributing to this issue. She has already been advised by Nuerosurgery to stop this giving her spinal issues. EKG obtained revealing sinus bradycardia. Vitals stable. BP elevated at 140/80 initially. After reassurance EKG looked good, BP rechecked and 130/80. Low suspicion for ACS. Will check STAT troponin to make doubly sure and ease patient's mind. Discussed avoidance of heavy lifting. OTC pain  medication. Resumption of tizanidine. Alarm signs/symptoms reviewed that would prompt ER assessment.  - EKG 12-Lead - Ambulatory referral to Cardiology - Troponin I  2. Elevated BP without diagnosis of hypertension Noted at Neurosurgery appointment -- only checked once while patient in significant pain secondary to chronic back issues. BP initially 140/80 in office. Patient anxious about BP. Rechecked and is down to 130/80. DASH diet. Patient to take tizanidine for chronic pain when she is home. Follow-up will be scheduled on labs have resulted.   Leeanne Rio, PA-C

## 2017-09-08 ENCOUNTER — Encounter: Payer: Self-pay | Admitting: Cardiology

## 2017-09-08 ENCOUNTER — Ambulatory Visit (INDEPENDENT_AMBULATORY_CARE_PROVIDER_SITE_OTHER): Payer: BC Managed Care – PPO | Admitting: Cardiology

## 2017-09-08 VITALS — BP 114/70 | HR 63 | Ht 63.0 in | Wt 142.6 lb

## 2017-09-08 DIAGNOSIS — R079 Chest pain, unspecified: Secondary | ICD-10-CM

## 2017-09-08 DIAGNOSIS — I10 Essential (primary) hypertension: Secondary | ICD-10-CM

## 2017-09-08 NOTE — Patient Instructions (Signed)
Medication Instructions:  Continue current medications  If you need a refill on your cardiac medications before your next appointment, please call your pharmacy.  Labwork: None Ordered   Testing/Procedures: Your physician has requested that you have a Coronary Calcium Score. This test is done at our [address] Office.   Follow-Up: Your physician wants you to follow-up in: As Needed.      Thank you for choosing [facility]!!       

## 2017-09-13 ENCOUNTER — Ambulatory Visit (INDEPENDENT_AMBULATORY_CARE_PROVIDER_SITE_OTHER)
Admission: RE | Admit: 2017-09-13 | Discharge: 2017-09-13 | Disposition: A | Discharge status: Home / Self Care | Payer: Self-pay | Source: Ambulatory Visit | Attending: Cardiology | Admitting: Cardiology

## 2017-09-13 DIAGNOSIS — R079 Chest pain, unspecified: Secondary | ICD-10-CM

## 2017-11-29 ENCOUNTER — Other Ambulatory Visit: Payer: Self-pay

## 2017-11-29 ENCOUNTER — Encounter: Payer: Self-pay | Admitting: Physician Assistant

## 2017-11-29 ENCOUNTER — Ambulatory Visit: Payer: BC Managed Care – PPO | Admitting: Physician Assistant

## 2017-11-29 VITALS — BP 124/78 | HR 60 | Temp 98.7°F | Resp 14 | Ht 63.0 in | Wt 147.0 lb

## 2017-11-29 DIAGNOSIS — H6123 Impacted cerumen, bilateral: Secondary | ICD-10-CM | POA: Diagnosis not present

## 2017-11-29 DIAGNOSIS — H6982 Other specified disorders of Eustachian tube, left ear: Secondary | ICD-10-CM

## 2017-11-29 MED ORDER — LORATADINE-PSEUDOEPHEDRINE ER 10-240 MG PO TB24: 1.0000 | ORAL_TABLET | Freq: Every day | ORAL | 0 refills | Status: DC

## 2017-11-29 MED ORDER — FLUTICASONE PROPIONATE 50 MCG/ACT NA SUSP: 2.0000 | Freq: Every day | NASAL | 6 refills | Status: DC

## 2017-11-29 NOTE — Progress Notes (Signed)
Patient presents to clinic today c/o nasal congestion with dry cough and L ear pain and pressure x 3 days. Denies change in hearing, dizziness or tinnitus. No symptoms of R ear noted. Patient has been taking claritin, hydrating and resting. Denies recent travel or sick contact.  Past Medical History:  Diagnosis Date  . DDD (degenerative disc disease), cervical   . Early menopause     Current Outpatient Medications on File Prior to Visit  Medication Sig Dispense Refill  . DULoxetine (CYMBALTA) 30 MG capsule Take 30 mg by mouth daily.    Marland Kitchen estradiol (ESTRACE) 1 MG tablet Take 1 mg by mouth daily.  11  . progesterone (PROMETRIUM) 100 MG capsule Take 100 mg by mouth daily.    Marland Kitchen tiZANidine (ZANAFLEX) 2 MG tablet TAKE 1 TABLET BY MOUTH EVERY 6-8 HOURS AS NEEDED FOR SPASM  0   No current facility-administered medications on file prior to visit.    Allergies  Allergen Reactions  . Hydrocodone-Acetaminophen Itching    Family History  Problem Relation Age of Onset  . Cancer Mother        skin  . Alcohol abuse Father   . Cancer Maternal Grandmother        breast  . Alcohol abuse Sister     Social History   Socioeconomic History  . Marital status: Married    Spouse name: None  . Number of children: 3  . Years of education: None  . Highest education level: None  Social Needs  . Financial resource strain: None  . Food insecurity - worry: None  . Food insecurity - inability: None  . Transportation needs - medical: None  . Transportation needs - non-medical: None  Occupational History  . Occupation: Pharmacist, hospital  Tobacco Use  . Smoking status: Never Smoker  . Smokeless tobacco: Never Used  Substance and Sexual Activity  . Alcohol use: Yes    Alcohol/week: 1.8 oz    Types: 3 Glasses of wine per week  . Drug use: None  . Sexual activity: Yes  Other Topics Concern  . None  Social History Narrative   Grandchild on the way.     Review of Systems - See HPI.  All other ROS are  negative.  BP 124/78   Pulse 60   Temp 98.7 F (37.1 C) (Oral)   Resp 14   Ht 5\' 3"  (1.6 m)   Wt 147 lb (66.7 kg)   SpO2 97%   BMI 26.04 kg/m   Physical Exam  Constitutional: She is oriented to person, place, and time.  HENT:  Head: Normocephalic and atraumatic.  Right Ear: External ear normal.  Left Ear: There is tenderness.  Nose: Rhinorrhea present. Right sinus exhibits no maxillary sinus tenderness and no frontal sinus tenderness. Left sinus exhibits no maxillary sinus tenderness and no frontal sinus tenderness.  Mouth/Throat: Uvula is midline, oropharynx is clear and moist and mucous membranes are normal.  Cerumen impaction noted bilaterally on initial examination.  Cardiovascular: Normal rate, regular rhythm, normal heart sounds and intact distal pulses.  Pulmonary/Chest: Effort normal and breath sounds normal. No respiratory distress. She has no wheezes. She has no rales. She exhibits no tenderness.  Neurological: She is alert and oriented to person, place, and time.  Skin: Skin is warm and dry. No rash noted.  Psychiatric: Affect normal.  Vitals reviewed.  Recent Results (from the past 2160 hour(s))  Troponin I     Status: None   Collection Time: 09/07/17  12:05 PM  Result Value Ref Range   TNIDX 0.01 0.00 - 0.06 ug/l   Assessment/Plan: 1. Eustachian tube dysfunction, left Stat Claritin-D and Flonase. Supportive measures reviewed. ENT if not improving. - loratadine-pseudoephedrine (CLARITIN-D 24 HOUR) 10-240 MG 24 hr tablet; Take 1 tablet by mouth daily.  Dispense: 30 tablet; Refill: 0 - fluticasone (FLONASE) 50 MCG/ACT nasal spray; Place 2 sprays into both nostrils daily.  Dispense: 16 g; Refill: 6  2. Bilateral impacted cerumen Removed via irrigation.    Leeanne Rio, PA-C

## 2017-11-29 NOTE — Patient Instructions (Signed)
Please stay well-hydrated. Start claritin-D (I have sent in a prescription) and Flonase as directed. This will help open the Eustachian tube and drain/dry up the fluid.  If not improving, we will need ENT assessment or repeat assessment here in office.    Earwax Buildup, Adult The ears produce a substance called earwax that helps keep bacteria out of the ear and protects the skin in the ear canal. Occasionally, earwax can build up in the ear and cause discomfort or hearing loss. What increases the risk? This condition is more likely to develop in people who:  Are female.  Are elderly.  Naturally produce more earwax.  Clean their ears often with cotton swabs.  Use earplugs often.  Use in-ear headphones often.  Wear hearing aids.  Have narrow ear canals.  Have earwax that is overly thick or sticky.  Have eczema.  Are dehydrated.  Have excess hair in the ear canal.  What are the signs or symptoms? Symptoms of this condition include:  Reduced or muffled hearing.  A feeling of fullness in the ear or feeling that the ear is plugged.  Fluid coming from the ear.  Ear pain.  Ear itch.  Ringing in the ear.  Coughing.  An obvious piece of earwax that can be seen inside the ear canal.  How is this diagnosed? This condition may be diagnosed based on:  Your symptoms.  Your medical history.  An ear exam. During the exam, your health care provider will look into your ear with an instrument called an otoscope.  You may have tests, including a hearing test. How is this treated? This condition may be treated by:  Using ear drops to soften the earwax.  Having the earwax removed by a health care provider. The health care provider may: ? Flush the ear with water. ? Use an instrument that has a loop on the end (curette). ? Use a suction device.  Surgery to remove the wax buildup. This may be done in severe cases.  Follow these instructions at home:  Take  over-the-counter and prescription medicines only as told by your health care provider.  Do not put any objects, including cotton swabs, into your ear. You can clean the opening of your ear canal with a washcloth or facial tissue.  Follow instructions from your health care provider about cleaning your ears. Do not over-clean your ears.  Drink enough fluid to keep your urine clear or pale yellow. This will help to thin the earwax.  Keep all follow-up visits as told by your health care provider. If earwax builds up in your ears often or if you use hearing aids, consider seeing your health care provider for routine, preventive ear cleanings. Ask your health care provider how often you should schedule your cleanings.  If you have hearing aids, clean them according to instructions from the manufacturer and your health care provider. Contact a health care provider if:  You have ear pain.  You develop a fever.  You have blood, pus, or other fluid coming from your ear.  You have hearing loss.  You have ringing in your ears that does not go away.  Your symptoms do not improve with treatment.  You feel like the room is spinning (vertigo). Summary  Earwax can build up in the ear and cause discomfort or hearing loss.  The most common symptoms of this condition include reduced or muffled hearing and a feeling of fullness in the ear or feeling that the ear is  plugged.  This condition may be diagnosed based on your symptoms, your medical history, and an ear exam.  This condition may be treated by using ear drops to soften the earwax or by having the earwax removed by a health care provider.  Do not put any objects, including cotton swabs, into your ear. You can clean the opening of your ear canal with a washcloth or facial tissue. This information is not intended to replace advice given to you by your health care provider. Make sure you discuss any questions you have with your health care  provider. Document Released: 12/09/2004 Document Revised: 01/12/2017 Document Reviewed: 01/12/2017 Elsevier Interactive Patient Education  [employer].

## 2018-05-22 LAB — HM PAP SMEAR

## 2018-05-22 LAB — HM MAMMOGRAPHY: HM Mammogram: NORMAL (ref 0–4)

## 2018-06-28 LAB — HEPATIC FUNCTION PANEL
ALT: 16 (ref 7–35)
AST: 15 (ref 13–35)
Alkaline Phosphatase: 72 (ref 25–125)
Bilirubin, Total: 0.4

## 2018-06-28 LAB — LIPID PANEL
Cholesterol: 235 — AB (ref 0–200)
HDL: 89 — AB (ref 35–70)
LDL Cholesterol: 125
LDL/HDL RATIO: 2.6
TRIGLYCERIDES: 105 (ref 40–160)

## 2018-06-28 LAB — BASIC METABOLIC PANEL
BUN: 18 (ref 4–21)
Creatinine: 0.7 (ref 0.5–1.1)
Glucose: 99
Potassium: 4.3 (ref 3.4–5.3)
SODIUM: 139 (ref 137–147)

## 2018-07-11 ENCOUNTER — Encounter: Payer: Self-pay | Admitting: General Practice

## 2019-01-08 ENCOUNTER — Other Ambulatory Visit: Payer: Self-pay

## 2019-01-08 ENCOUNTER — Encounter: Payer: Self-pay | Admitting: Physician Assistant

## 2019-01-08 ENCOUNTER — Ambulatory Visit: Payer: BC Managed Care – PPO | Admitting: Physician Assistant

## 2019-01-08 VITALS — BP 120/80 | HR 70 | Temp 98.3°F | Resp 16 | Ht 63.0 in | Wt 149.0 lb

## 2019-01-08 DIAGNOSIS — R05 Cough: Secondary | ICD-10-CM

## 2019-01-08 DIAGNOSIS — S1086XA Insect bite of other specified part of neck, initial encounter: Secondary | ICD-10-CM

## 2019-01-08 DIAGNOSIS — W57XXXA Bitten or stung by nonvenomous insect and other nonvenomous arthropods, initial encounter: Secondary | ICD-10-CM | POA: Diagnosis not present

## 2019-01-08 DIAGNOSIS — R058 Other specified cough: Secondary | ICD-10-CM

## 2019-01-08 MED ORDER — TRIAMCINOLONE ACETONIDE 0.1 % EX CREA: 1.0000 "application " | TOPICAL_CREAM | Freq: Two times a day (BID) | CUTANEOUS | 0 refills | Status: DC

## 2019-01-08 MED ORDER — BENZONATATE 100 MG PO CAPS: 100.0000 mg | ORAL_CAPSULE | Freq: Three times a day (TID) | ORAL | 0 refills | Status: DC | PRN

## 2019-01-08 MED ORDER — FLUTICASONE PROPIONATE 50 MCG/ACT NA SUSP: 2.0000 | Freq: Every day | NASAL | 0 refills | Status: AC

## 2019-01-08 MED ORDER — MONTELUKAST SODIUM 10 MG PO TABS: 10.0000 mg | ORAL_TABLET | Freq: Every day | ORAL | 3 refills | Status: DC

## 2019-01-08 NOTE — Patient Instructions (Signed)
Cough seems like it is stemming from allergic inflammation and likely a mild post-nasal drip. Start saline nasal rinse daily. Start Flonase (a prescription has been sent in) Continue Claritin (Would avoid the Claritin-D). Start the Singulair as directed. The Tessalon is for cough itself.  Follow-up in 7-10 days. If not noting significant improvement, will get x-ray to further assess.  For the rash, it seems to potentially be a bite in the area that is causing a mild allergic response (histamine release causing swelling and itching). The other thought was an atypical shingles presentation (itch versus pain) but no blistering noted. I want you to continue your Claritin. Add on a Famotidine (Pepcid) in the evening for a few nights. Keep skin clean and dry. Apply Kenalog cream twice daily over the next week. If symptoms not improving or resolving, please come see [place].

## 2019-01-08 NOTE — Progress Notes (Signed)
Patient presents to clinic today c/o 2 months of a persistent cough described as a tickle in her throat. Notes this is worse at night or with change in environment. Notes PND. Denies chest congestion, chest pain or SOB. Denies wheezing. Denies fever, chills, malaise or fatigue. Denies heartburn or reflux.  Patient also notes 1 day of a pruritic rash of her right anterior neck. Notes two areas or erythema and raised skin. Denies pain, tenderness, burning or tingling. Denies change to soaps or lotions.  Past Medical History:  Diagnosis Date  . DDD (degenerative disc disease), cervical   . Early menopause     Current Outpatient Medications on File Prior to Visit  Medication Sig Dispense Refill  . DULoxetine (CYMBALTA) 30 MG capsule Take 30 mg by mouth daily.    Marland Kitchen estradiol (ESTRACE) 1 MG tablet Take 1 mg by mouth daily.  11  . loratadine-pseudoephedrine (CLARITIN-D 24 HOUR) 10-240 MG 24 hr tablet Take 1 tablet by mouth daily. 30 tablet 0  . progesterone (PROMETRIUM) 100 MG capsule Take 100 mg by mouth daily.    Marland Kitchen tiZANidine (ZANAFLEX) 2 MG tablet TAKE 1 TABLET BY MOUTH EVERY 6-8 HOURS AS NEEDED FOR SPASM  0   No current facility-administered medications on file prior to visit.     Allergies  Allergen Reactions  . Hydrocodone-Acetaminophen Itching    Family History  Problem Relation Age of Onset  . Cancer Mother        skin  . Alcohol abuse Father   . Cancer Maternal Grandmother        breast  . Alcohol abuse Sister     Social History   Socioeconomic History  . Marital status: Married    Spouse name: Not on file  . Number of children: 3  . Years of education: Not on file  . Highest education level: Not on file  Occupational History  . Occupation: Pharmacist, hospital  Social Needs  . Financial resource strain: Not on file  . Food insecurity:    Worry: Not on file    Inability: Not on file  . Transportation needs:    Medical: Not on file    Non-medical: Not on file  Tobacco  Use  . Smoking status: Never Smoker  . Smokeless tobacco: Never Used  Substance and Sexual Activity  . Alcohol use: Yes    Alcohol/week: 3.0 standard drinks    Types: 3 Glasses of wine per week  . Drug use: Not on file  . Sexual activity: Yes  Lifestyle  . Physical activity:    Days per week: Not on file    Minutes per session: Not on file  . Stress: Not on file  Relationships  . Social connections:    Talks on phone: Not on file    Gets together: Not on file    Attends religious service: Not on file    Active member of club or organization: Not on file    Attends meetings of clubs or organizations: Not on file    Relationship status: Not on file  Other Topics Concern  . Not on file  Social History Narrative   Grandchild on the way.     Review of Systems - See HPI.  All other ROS are negative.  BP 120/80   Pulse 70   Temp 98.3 F (36.8 C) (Oral)   Resp 16   Ht 5\' 3"  (1.6 m)   Wt 149 lb (67.6 kg)   SpO2 99%  BMI 26.39 kg/m   Physical Exam Vitals signs reviewed.  Constitutional:      Appearance: Normal appearance.  HENT:     Head: Normocephalic and atraumatic.     Right Ear: Tympanic membrane normal.     Left Ear: Tympanic membrane normal.     Nose: Nose normal.     Mouth/Throat:     Mouth: Mucous membranes are moist.     Pharynx: Oropharynx is clear.  Eyes:     Conjunctiva/sclera: Conjunctivae normal.     Pupils: Pupils are equal, round, and reactive to light.  Neck:     Musculoskeletal: Neck supple.  Cardiovascular:     Rate and Rhythm: Normal rate and regular rhythm.     Pulses: Normal pulses.     Heart sounds: Normal heart sounds.  Pulmonary:     Effort: Pulmonary effort is normal.     Breath sounds: Normal breath sounds.  Skin:    General: Skin is warm.     Findings: Rash (2 lesions of right anteror neck noted. Raised and erythematous without induration or fluctuance. punctate marks noted in the center, concerning for bite.) present.    Neurological:     General: No focal deficit present.     Mental Status: She is alert and oriented to person, place, and time.  Psychiatric:        Mood and Affect: Mood normal.     Assessment/Plan: 1. Allergic cough Start Tessalon to help with symptomatic cough. Continue antihistamine. Restart Flonase. Will begin Singulair. Close follow-up scheduled. If not improving, will need imaging and Pulm assessment.  - montelukast (SINGULAIR) 10 MG tablet; Take 1 tablet (10 mg total) by mouth at bedtime.  Dispense: 30 tablet; Refill: 3 - benzonatate (TESSALON) 100 MG capsule; Take 1 capsule (100 mg total) by mouth 3 (three) times daily as needed for cough.  Dispense: 30 capsule; Refill: 0  2. Insect bite of other part of neck, initial encounter Start triamcinolone to the area BID. Supportive measures and OTC medications reviewed. Reviewed signs/symptoms of infection that would prompt need for antibiotic. - triamcinolone cream (KENALOG) 0.1 %; Apply 1 application topically 2 (two) times daily.  Dispense: 30 g; Refill: 0   Leeanne Rio, PA-C

## 2019-01-30 ENCOUNTER — Other Ambulatory Visit: Payer: Self-pay | Admitting: Physician Assistant

## 2019-01-30 DIAGNOSIS — R05 Cough: Secondary | ICD-10-CM

## 2019-01-30 DIAGNOSIS — R058 Other specified cough: Secondary | ICD-10-CM

## 2019-02-14 ENCOUNTER — Other Ambulatory Visit: Payer: Self-pay | Admitting: Physician Assistant

## 2019-02-14 DIAGNOSIS — R058 Other specified cough: Secondary | ICD-10-CM

## 2019-02-14 DIAGNOSIS — R05 Cough: Secondary | ICD-10-CM

## 2019-02-14 MED ORDER — BENZONATATE 100 MG PO CAPS: 100.0000 mg | ORAL_CAPSULE | Freq: Three times a day (TID) | ORAL | 0 refills | Status: DC | PRN

## 2019-04-02 ENCOUNTER — Other Ambulatory Visit: Payer: Self-pay | Admitting: Physician Assistant

## 2019-04-02 DIAGNOSIS — R058 Other specified cough: Secondary | ICD-10-CM

## 2019-04-02 DIAGNOSIS — R05 Cough: Secondary | ICD-10-CM

## 2019-04-23 ENCOUNTER — Ambulatory Visit (INDEPENDENT_AMBULATORY_CARE_PROVIDER_SITE_OTHER): Payer: BC Managed Care – PPO | Admitting: Family Medicine

## 2019-04-23 ENCOUNTER — Ambulatory Visit: Payer: Self-pay | Admitting: Family Medicine

## 2019-04-23 ENCOUNTER — Other Ambulatory Visit: Payer: Self-pay

## 2019-04-23 ENCOUNTER — Encounter: Payer: Self-pay | Admitting: Family Medicine

## 2019-04-23 ENCOUNTER — Telehealth: Payer: Self-pay | Admitting: *Deleted

## 2019-04-23 VITALS — Temp 99.8°F

## 2019-04-23 DIAGNOSIS — F1010 Alcohol abuse, uncomplicated: Secondary | ICD-10-CM | POA: Diagnosis not present

## 2019-04-23 DIAGNOSIS — Z20828 Contact with and (suspected) exposure to other viral communicable diseases: Secondary | ICD-10-CM | POA: Diagnosis not present

## 2019-04-23 DIAGNOSIS — Z20822 Contact with and (suspected) exposure to covid-19: Secondary | ICD-10-CM

## 2019-04-23 DIAGNOSIS — R6889 Other general symptoms and signs: Secondary | ICD-10-CM | POA: Diagnosis not present

## 2019-04-23 MED ORDER — ONDANSETRON HCL 4 MG PO TABS: 4.0000 mg | ORAL_TABLET | Freq: Three times a day (TID) | ORAL | 0 refills | Status: DC | PRN

## 2019-04-23 NOTE — Progress Notes (Signed)
Virtual Visit via Video   I connected with patient on 04/23/19 at  2:20 PM EDT by a video enabled telemedicine application and verified that I am speaking with the correct person using two identifiers.  Location patient: Home Location provider: Acupuncturist, Office Persons participating in the virtual visit: Patient, Provider, [place] (Jess B)  I discussed the limitations of evaluation and management by telemedicine and the availability of in person appointments. The patient expressed understanding and agreed to proceed.  Subjective:   HPI:   Possible COVID- 'I feel horrible'.  + nausea over the weekend.  Now vomiting. Now w/ low grade fever.  Last weekend that she had a 'sinus infxn'- HA, nasal congestion.  Denies body aches.  + fatigue.  No cough/SOB.  + abnormal taste in mouth today.  + decreased appetite.  No known sick contacts.  Pt went to [employer] and [employer] for [place].  Went to [facility] in [place].     Alcohol abuse- pt reports she has been drinking 'very heavily starting at 3 o'clock'.  Pt stopped drinking when she started feeling poorly and has not had a drink in 6 days.  Has no cravings or desire.  + family hx of alcoholism.    ROS:   See pertinent positives and negatives per HPI.  Patient Active Problem List   Diagnosis Date Noted  . 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 Smoking status: Never Smoker  . Smokeless tobacco: Never Used  Substance Use Topics  . Alcohol use: Yes    Alcohol/week: 3.0 standard drinks    Types: 3 Glasses of wine per week    Current Outpatient Medications:  .  DULoxetine (CYMBALTA) 30 MG capsule, Take 30 mg by mouth daily., Disp: , Rfl:  .  estradiol (ESTRACE) 1 MG tablet, Take 1 mg by mouth daily., Disp: , Rfl: 11 .  fluticasone (FLONASE) 50 MCG/ACT nasal spray, Place 2 sprays into both nostrils daily., Disp: 16 g, Rfl: 0 .  loratadine-pseudoephedrine (CLARITIN-D 24  HOUR) 10-240 MG 24 hr tablet, Take 1 tablet by mouth daily., Disp: 30 tablet, Rfl: 0 .  progesterone (PROMETRIUM) 100 MG capsule, Take 100 mg by mouth daily., Disp: , Rfl:  .  tiZANidine (ZANAFLEX) 2 MG tablet, TAKE 1 TABLET BY MOUTH EVERY 6-8 HOURS AS NEEDED FOR SPASM, Disp: , Rfl: 0 .  triamcinolone cream (KENALOG) 0.1 %, Apply 1 application topically 2 (two) times daily., Disp: 30 g, Rfl: 0 .  montelukast (SINGULAIR) 10 MG tablet, TAKE 1 TABLET BY MOUTH EVERYDAY AT BEDTIME (Patient not taking: Reported on 04/23/2019), Disp: 90 tablet, Rfl: 1  Allergies  Allergen Reactions  . Hydrocodone-Acetaminophen Itching    Objective:   Temp 99.8 F (37.7 C) (Oral) Comment: has not had tylenol since this morning  AAOx3, NAD NCAT, EOMI No obvious CN deficits Mildly pale Pt is able to speak clearly, coherently without shortness of breath or increased work of breathing.  Thought process is linear. Pt burst into tears when talking about drinking  Assessment and Plan:   Suspected COVID- pt travelled to [name] and went to the [place] and [employer]  She developed what she thought was a 'sinus infxn' a week after her trip and then this weekend (2 weeks after travel) she developed N/V low grade temp and excessive fatigue.  Will arrange outpt COVID testing.  Encouraged self isolation, fluids, Zofran PRN, tylenol and rest.  Reviewed  supportive care and red flags that should prompt return.  Pt expressed understanding and is in agreement w/ plan.   Alcohol abuse- pt reports she was drinking heavily each day starting at 3pm.  + family hx of alcoholism.  She was not able to say how long she has been drinking but stopped abruptly 6+ days ago.  Reviewed that if she were going to have withdrawal sxs, they typically occur before now.  She states she is not interested in drinking at this time but is interested in counseling to develop better coping skills.  Will refer for counseling ASAP.  Pt instructed to  call me immediately if she relapses and we will then pursue a more intensive alcohol program.  Pt expressed understanding and is in agreement w/ plan.    Annye Asa, MD 04/23/2019

## 2019-04-23 NOTE — Telephone Encounter (Signed)
Previously contacted pt's daughter, Rivka Barbara, and she would like to schedule her mother's appointment at the same time as her; she was offered and accepted an appointment on the pt's behalf 04/24/2019 0800 at [facility] site; pt's daughter given address, location, and instructions that she and all occupants of her vehicle should wear a mask; she verbalized understanding; orders placed per protocol; will route to office for notification.

## 2019-04-23 NOTE — Progress Notes (Signed)
I have discussed the procedure for the virtual visit with the patient who has given consent to proceed with assessment and treatment.   Cathi Hazan L Rosebud Koenen, CMA     

## 2019-04-23 NOTE — Telephone Encounter (Signed)
FYI

## 2019-04-23 NOTE — Telephone Encounter (Signed)
Pt is scheduled for a VV

## 2019-04-23 NOTE — Telephone Encounter (Signed)
Pt. Reports she started feeling bad 2 weeks ago - felt like she had some sinus issues. Today has temp. 100.3, headache, nausea, achy. Warm transfer to [facility] in the practice for a virtual visit. Answer Assessment - Initial Assessment Questions 1. COVID-19 DIAGNOSIS: "Who made your Coronavirus (COVID-19) diagnosis?" "Was it confirmed by a positive lab test?" If not diagnosed by a HCP, ask "Are there lots of cases (community spread) where you live?" (See public health department website, if unsure)     No 2. ONSET: "When did the COVID-19 symptoms start?"      Started 2 weeks ago 3. WORST SYMPTOM: "What is your worst symptom?" (e.g., cough, fever, shortness of breath, muscle aches)     Low grade fever, nausea, headache 4. COUGH: "Do you have a cough?" If so, ask: "How bad is the cough?"       Slight 5. FEVER: "Do you have a fever?" If so, ask: "What is your temperature, how was it measured, and when did it start?"     100.3 6. RESPIRATORY STATUS: "Describe your breathing?" (e.g., shortness of breath, wheezing, unable to speak)      No 7. BETTER-SAME-WORSE: "Are you getting better, staying the same or getting worse compared to yesterday?"  If getting worse, ask, "In what way?"     Worse 8. HIGH RISK DISEASE: "Do you have any chronic medical problems?" (e.g., asthma, heart or lung disease, weak immune system, etc.)      No 9. PREGNANCY: "Is there any chance you are pregnant?" "When was your last menstrual period?"     No 10. OTHER SYMPTOMS: "Do you have any other symptoms?"  (e.g., chills, fatigue, headache, loss of smell or taste, muscle pain, sore throat)       Achy, sweaty  Protocols used: CORONAVIRUS (COVID-19) DIAGNOSED OR SUSPECTED-A-AH

## 2019-04-24 ENCOUNTER — Other Ambulatory Visit: Payer: BC Managed Care – PPO

## 2019-04-24 DIAGNOSIS — Z20822 Contact with and (suspected) exposure to covid-19: Secondary | ICD-10-CM

## 2019-04-26 ENCOUNTER — Telehealth: Payer: Self-pay | Admitting: Family Medicine

## 2019-04-26 NOTE — Progress Notes (Signed)
Called pt and lmovm to return call.    Ok for PEC to Discuss results / PCP recommendations / Schedule patient.  

## 2019-04-26 NOTE — Telephone Encounter (Signed)
Called pt and informed of PCP recommendation.

## 2019-04-26 NOTE — Telephone Encounter (Signed)
Pt returned your call regarding labs, please call back.

## 2019-04-28 LAB — NOVEL CORONAVIRUS, NAA: SARS-CoV-2, NAA: DETECTED — AB

## 2019-05-02 ENCOUNTER — Telehealth: Payer: Self-pay | Admitting: Family Medicine

## 2019-05-02 NOTE — Telephone Encounter (Signed)
Pt called in asking if she should get the COVID testing done again or just wait until she has no symptoms to start going about her daily living activities again. Pt can be reached at the cell #

## 2019-05-02 NOTE — Telephone Encounter (Signed)
Does pt need repeat testing?

## 2019-05-02 NOTE — Telephone Encounter (Signed)
We are not doing test of cure.  If she has been afebrile for more than 3 days and is asymptomatic (no longer having productive cough, sore throat, body aches, chills etc) she can resume normal activities.

## 2019-05-03 NOTE — Telephone Encounter (Signed)
Spoke with pt and advised of PCP recommendations. Pt wanted to know if when her symptoms resolve we could write a note for her to return to work? I advised that would not be a problem.

## 2019-11-03 ENCOUNTER — Other Ambulatory Visit: Payer: Self-pay | Admitting: Family Medicine

## 2019-11-03 DIAGNOSIS — R05 Cough: Secondary | ICD-10-CM

## 2019-11-03 DIAGNOSIS — R058 Other specified cough: Secondary | ICD-10-CM

## 2020-05-16 ENCOUNTER — Other Ambulatory Visit: Payer: Self-pay | Admitting: Family Medicine

## 2020-05-16 DIAGNOSIS — R058 Other specified cough: Secondary | ICD-10-CM

## 2020-07-24 LAB — COLOGUARD: COLOGUARD: NEGATIVE

## 2020-08-04 LAB — HM PAP SMEAR

## 2020-08-04 LAB — HM MAMMOGRAPHY: HM Mammogram: NORMAL (ref 0–4)

## 2020-08-07 ENCOUNTER — Other Ambulatory Visit: Payer: Self-pay | Admitting: Obstetrics and Gynecology

## 2020-08-07 DIAGNOSIS — R928 Other abnormal and inconclusive findings on diagnostic imaging of breast: Secondary | ICD-10-CM

## 2020-08-15 ENCOUNTER — Encounter: Payer: Self-pay | Admitting: General Practice

## 2020-08-19 ENCOUNTER — Other Ambulatory Visit: Payer: Self-pay

## 2020-08-19 ENCOUNTER — Other Ambulatory Visit: Payer: Self-pay | Admitting: Obstetrics and Gynecology

## 2020-08-19 ENCOUNTER — Ambulatory Visit
Admission: RE | Admit: 2020-08-19 | Discharge: 2020-08-19 | Disposition: A | Discharge status: Home / Self Care | Payer: BC Managed Care – PPO | Source: Ambulatory Visit | Attending: Obstetrics and Gynecology | Admitting: Obstetrics and Gynecology

## 2020-08-19 DIAGNOSIS — R928 Other abnormal and inconclusive findings on diagnostic imaging of breast: Secondary | ICD-10-CM

## 2020-08-28 ENCOUNTER — Other Ambulatory Visit: Payer: Self-pay

## 2020-08-28 ENCOUNTER — Ambulatory Visit
Admission: RE | Admit: 2020-08-28 | Discharge: 2020-08-28 | Disposition: A | Discharge status: Home / Self Care | Payer: BC Managed Care – PPO | Source: Ambulatory Visit | Attending: Obstetrics and Gynecology | Admitting: Obstetrics and Gynecology

## 2020-08-28 DIAGNOSIS — R928 Other abnormal and inconclusive findings on diagnostic imaging of breast: Secondary | ICD-10-CM

## 2020-08-28 DIAGNOSIS — C50919 Malignant neoplasm of unspecified site of unspecified female breast: Secondary | ICD-10-CM

## 2020-08-28 HISTORY — DX: Malignant neoplasm of unspecified site of unspecified female breast: C50.919

## 2020-09-15 ENCOUNTER — Ambulatory Visit: Payer: Self-pay | Admitting: Surgery

## 2020-09-15 DIAGNOSIS — D0511 Intraductal carcinoma in situ of right breast: Secondary | ICD-10-CM

## 2020-09-15 NOTE — H&P (Signed)
ARVADA SEABORN Appointment: 09/15/2020 9:20 AM Location: [facility] Patient #: [id number] DOB: [birth year] Married / Language: English / Race: White Female  History of Present Illness Lori Moores A. Reka Wist [name]; 09/15/2020 10:18 AM) Patient words: Patient presents at the request of Dr. Jimmye [name] [facility] due to abnormal breast mammogram. 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 Patient states she felt something was all but denied breast pain, breast mass or nipple discharge. Strong history of breast cancer noted with 4 for treatment relatives diagnosed in her 35s. Final pathology showed high-grade DCIS as stated above. Density of breast B.      CLINICAL DATA: The patient was called back for right breast calcifications.  EXAM: DIGITAL DIAGNOSTIC RIGHT MAMMOGRAM  COMPARISON: Previous exam(s).  ACR Breast Density Category b: There are scattered areas of fibroglandular density.  FINDINGS: The new calcifications in the lateral central right breast span 5.5 mm and are pleomorphic.  IMPRESSION: Indeterminate new right breast calcifications spanning 5.5 mm.  RECOMMENDATION: Recommend stereotactic biopsy of the new right breast calcifications.  I have discussed the findings and recommendations with the patient. If applicable, a reminder letter will be sent to the patient regarding the next appointment.  BI-RADS CATEGORY 4: Suspicious.   Electronically Signed By: Lori [name] On: 08/19/2020 16:01         Patient: Lori [name] Collected: 08/28/2020 Client: [facility] Accession: [id number] Received: 08/28/2020 Lori Kuster, [name] DOB: 07-27-1966 Age: 54 Gender: F Reported: 08/29/2020 [address] [place] [address] Patient Ph: [phone] MRN #: [id number]  [place], [place] [address] Client Acc#: Chart #: [id number] Phone: Fax: CC: Lori Posey, [name] Lori [name] REPORT OF SURGICAL PATHOLOGY ADDITIONAL INFORMATION: PROGNOSTIC INDICATORS Results: IMMUNOHISTOCHEMICAL AND MORPHOMETRIC ANALYSIS PERFORMED MANUALLY Estrogen Receptor: 0%, NEGATIVE Progesterone Receptor: 0%, NEGATIVE COMMENT: The negative hormone receptor study(ies) in this case has an internal positive control. 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 1 of 2 FINAL for Lori [name], Lori [name] [phone]) Microscopic Comment The DCIS appears to partially involve a fibroadenoma. Ancillary studies will be reported separately. Results reported to The Lori [name] on 08/29/2020. Lori [name] reviewed. Lori [name], Electronic Signature (Case signed 08/29/2020.  The patient is a 54 year old female.   Past Surgical History Lori [name], Lori [name]; 09/15/2020 9:25 AM) Breast Biopsy Right. Oral Surgery Spinal Surgery - Lower Back Spinal Surgery - Neck  Diagnostic Studies History Lori [name], Lori [name]; 09/15/2020 9:25 AM) Colonoscopy never Mammogram within last year Pap Smear 1-5 years ago  Allergies Lori [name], Lori [name]; 09/15/2020 9:26 AM) HYDROcodone-Acetaminophen *ANALGESICS - OPIOID*  Medication History Lori [name], Lori [name]; 09/15/2020 9:27 AM) Estradiol (1MG  Tablet, Oral) Active. Progesterone Micronized (100MG  Capsule, Oral) Active. DULoxetine HCl (30MG  Capsule DR Part, Oral) Active. Loratadine (10MG  Capsule, Oral) Active. Medications Reconciled  Pregnancy / Birth History Lori [name],  Lori [name]; 09/15/2020 9:25 AM) Age at menarche 70 years. Age of menopause <43 Contraceptive History Oral contraceptives. Gravida 5 Irregular periods Length (months) of breastfeeding 7-12 Maternal age 33-25 Para 3  Other Problems Lori [name], Lori [name]; 09/15/2020 9:25 AM) Anxiety  Disorder Back Pain Breast Cancer Lump In Breast     Review of Systems Lori [name] Lori [name]; 09/15/2020 9:25 AM) General Not Present- Appetite Loss, Chills, Fatigue, Fever, Night Sweats, Weight Gain and Weight Loss. Skin Not Present- Change in Wart/Mole, Dryness, Hives, Jaundice, New Lesions, Non-Healing Wounds, Rash and Ulcer. HEENT Present- Seasonal Allergies and Wears glasses/contact lenses. Not Present- Earache, Hearing Loss, Hoarseness, Nose Bleed, Oral Ulcers, Ringing in the Ears, Sinus Pain, Sore Throat, Visual Disturbances and Yellow Eyes. Breast Present- Breast Mass and Breast Pain. Not Present- Nipple Discharge and Skin Changes. Cardiovascular Not Present- Chest Pain, Difficulty Breathing Lying Down, Leg Cramps, Palpitations, Rapid Heart Rate, Shortness of Breath and Swelling of Extremities. Gastrointestinal Not Present- Abdominal Pain, Bloating, Bloody Stool, Change in Bowel Habits, Chronic diarrhea, Constipation, Difficulty Swallowing, Excessive gas, Gets full quickly at meals, Hemorrhoids, Indigestion, Nausea, Rectal Pain and Vomiting. Female Genitourinary Not Present- Frequency, Nocturia, Painful Urination, Pelvic Pain and Urgency. Musculoskeletal Present- Back Pain. Not Present- Joint Pain, Joint Stiffness, Muscle Pain, Muscle Weakness and Swelling of Extremities. Neurological Not Present- Decreased Memory, Fainting, Headaches, Numbness, Seizures, Tingling, Tremor, Trouble walking and Weakness. Psychiatric Not Present- Anxiety, Bipolar, Change in Sleep Pattern, Depression, Fearful and Frequent crying. Endocrine Not Present- Cold Intolerance, Excessive Hunger, Hair Changes, Heat Intolerance, Hot  flashes and New Diabetes. Hematology Not Present- Blood Thinners, Easy Bruising, Excessive bleeding, Gland problems, HIV and Persistent Infections.  Vitals Lori [name] Lori [name]; 09/15/2020 9:28 AM) 09/15/2020 9:27 AM Weight: 146.6 lb Height: 63in Body Surface Area: 1.69 m Body Mass Index: 25.97 kg/m  Temp.: 98.64F(Tympanic)  Pulse: 74 (Regular)  BP: 130/82(Sitting, Left Arm, Standard)        Physical Exam (Wille Aubuchon A. Nickholas Goldston [name]; 09/15/2020 10:19 AM)  General Mental Status-Alert. General Appearance-Consistent with stated age. Hydration-Well hydrated. Voice-Normal.  Head and Neck Head-normocephalic, atraumatic with no lesions or palpable masses. Trachea-midline. Thyroid Gland Characteristics - normal size and consistency.  Chest and Lung Exam Chest and lung exam reveals -quiet, even and easy respiratory effort with no use of accessory muscles and on auscultation, normal breath sounds, no adventitious sounds and normal vocal resonance. Inspection Chest Wall - Normal. Back - normal.  Breast Breast - Left-Symmetric, Non Tender, No Biopsy scars, no Dimpling - Left, No Inflammation, No Lumpectomy scars, No Mastectomy scars, No Peau d' Orange. Breast - Right-Symmetric, Non Tender, No Biopsy scars, no Dimpling - Right, No Inflammation, No Lumpectomy scars, No Mastectomy scars, No Peau d' Orange. Breast Lump-No Palpable Breast Mass.  Cardiovascular Cardiovascular examination reveals -normal heart sounds, regular rate and rhythm with no murmurs and normal pedal pulses bilaterally.  Neurologic Neurologic evaluation reveals -alert and oriented x 3 with no impairment of recent or remote memory. Mental Status-Normal.  Lymphatic Head & Neck  General Head & Neck Lymphatics: Bilateral - Description - Normal. Axillary  General Axillary Region: Bilateral - Description - Normal. Tenderness - Non Tender.    Assessment & Plan (Sharmain Lastra A. Nicklas Mcsweeney [name];  09/15/2020 10:20 AM)  BREAST NEOPLASM, TIS (DCIS), RIGHT (D05.11) Impression: Discussed surgical management as well as medical management of this disease. She is opted for right breast seed localized lumpectomy. Discussed mastectomy and reconstruction. Discussed genetic counseling given family history. Refer to medical and radiation oncology as well as genetics. Plan for right breast seed localized lumpectomy. Discussed long-term prognosis and long-term expectations as well as the possible need for radiation therapy and the impact that has on cosmesis. Total time 45 minutes   Risk of lumpectomy include bleeding, infection,  seroma, more surgery, use of seed/wire, wound care, cosmetic deformity and the need for other treatments, death , blood clots, death. Pt agrees to proceed.  Current Plans You are being scheduled for surgery- Our schedulers will call you.  You should hear from our office's scheduling department within 5 working days about the location, date, and time of surgery. We try to make accommodations for patient's preferences in scheduling surgery, but sometimes the OR schedule or the surgeon's schedule prevents [place] from making those accommodations.  If you have not heard from our office [phone]) in 5 working days, call the office and ask for your surgeon's nurse.  If you have other questions about your diagnosis, plan, or surgery, call the office and ask for your surgeon's nurse.  Pt Education - Pamphlet Given - Breast Biopsy: discussed with patient and provided information.

## 2020-09-18 ENCOUNTER — Telehealth: Payer: Self-pay | Admitting: Oncology

## 2020-09-18 NOTE — Telephone Encounter (Signed)
Received referrals from [facility] for med onc and genetics. Ms. Lori [name] has been cld and scheduled to see Dr. Jana Hakim on 11/15 at 4pm w/labs at 330pm and Santiago Glad for genetics on 11/29 at [place]. Pt aware to arrive 15 minutes early for both appts.

## 2020-09-19 ENCOUNTER — Telehealth: Payer: Self-pay | Admitting: Genetic Counselor

## 2020-09-19 NOTE — Telephone Encounter (Signed)
Pt cld to r/s her genetic counseling appt to 1pm to see Cari.

## 2020-09-24 NOTE — Progress Notes (Signed)
Location of Breast Cancer: Right Breast  Did patient present with symptoms (if so, please note symptoms) or was this found on screening mammography?: Routine mammogram.  Patient noted over the summer that she felt something wasn't quite right in her breast.  Mammogram: 5 mm cluster upper quadrant right breast.   Histology per Pathology Report: Right Breast 08/28/2020  Receptor Status: ER(- 0%), PR (- 0%), Her2-neu (), Ki-()    Past/Anticipated interventions by surgeon, if any: Dr. Brantley Stage 09/15/2020 -She is opted for right breast seed localized lumpectomy. -Discussed genetic counseling given family history. Refer to medical and radiation oncology as well as genetics. -Surgery unscheduled at this time.  Past/Anticipated interventions by medical oncology, if any: Chemotherapy  Dr. Jana Hakim 09/29/2020   Lymphedema issues, if any: no   Pain issues, if any:  No  SAFETY ISSUES:  Prior radiation? no  Pacemaker/ICD? No  Possible current pregnancy? Postmenopausal  Is the patient on methotrexate? no  Current Complaints / other details:   Genetics 10/13/2020     Cori Razor, RN 09/24/2020,10:54 AM

## 2020-09-25 ENCOUNTER — Ambulatory Visit
Admission: RE | Admit: 2020-09-25 | Discharge: 2020-09-25 | Disposition: A | Discharge status: Home / Self Care | Payer: BC Managed Care – PPO | Source: Ambulatory Visit | Attending: Radiation Oncology | Admitting: Radiation Oncology

## 2020-09-25 ENCOUNTER — Other Ambulatory Visit: Payer: Self-pay

## 2020-09-25 ENCOUNTER — Encounter: Payer: Self-pay | Admitting: Radiation Oncology

## 2020-09-25 VITALS — BP 143/69 | HR 69 | Temp 97.7°F | Resp 18 | Ht 63.0 in | Wt 146.4 lb

## 2020-09-25 DIAGNOSIS — D0511 Intraductal carcinoma in situ of right breast: Secondary | ICD-10-CM | POA: Diagnosis not present

## 2020-09-25 DIAGNOSIS — Z79899 Other long term (current) drug therapy: Secondary | ICD-10-CM | POA: Diagnosis not present

## 2020-09-25 DIAGNOSIS — Z809 Family history of malignant neoplasm, unspecified: Secondary | ICD-10-CM | POA: Diagnosis not present

## 2020-09-25 DIAGNOSIS — M5030 Other cervical disc degeneration, unspecified cervical region: Secondary | ICD-10-CM | POA: Insufficient documentation

## 2020-09-25 DIAGNOSIS — Z171 Estrogen receptor negative status [ER-]: Secondary | ICD-10-CM | POA: Insufficient documentation

## 2020-09-27 NOTE — Progress Notes (Signed)
[place]  [phone] [phone]     ID: ALLISSA ALBRIGHT DOB: July 19, 1966  MR#: [id number]  GYI#:[id number]  Patient Care Team: Midge Minium, MD as PCP - General (Family Medicine) Erroll Luna, MD as Consulting Physician (General Surgery) Kyung Rudd, MD as Consulting Physician (Radiation Oncology) Delmi Fulfer, Virgie Dad, MD as Consulting Physician (Oncology) Molli Posey, MD as Consulting Physician (Obstetrics and Gynecology) Chauncey Cruel, MD OTHER MD:  CHIEF COMPLAINT: noninvasive breast cancer, estrogen receptor negative  CURRENT TREATMENT: awaiting definitive surgery   HISTORY OF CURRENT ILLNESS: Roseana H Hanger had routine screening mammography at [facility] on 08/04/2020 showing calcifications in the right breast. She underwent right diagnostic mammography with tomography at [facility] on 08/19/2020 showing: breast density category B; indeterminate 0.55 cm right breast calcifications.  Accordingly on 08/28/2020 she proceeded to biopsy of the right breast area in question. The pathology from this procedure ([id number]) showed: ductal carcinoma in situ, high grade, with central necrosis and calcifications, partially involving a fibroadenoma. Prognostic indicators significant for: estrogen receptor, 0% negative and progesterone receptor, 0% negative.   The patient's subsequent history is as detailed below.   INTERVAL HISTORY: Patric was evaluated in the breast cancer clinic on 09/30/2020. Her case was also presented at the multidisciplinary breast cancer conference on 09/03/2020. At that time a preliminary plan was proposed: lumpectomy, adjuvant radiation, no antiestrogens, genetics testing  She is scheduled for right lumpectomy on 11/13/2020 under Dr. Brantley Stage and for genetics testing 10/13/2020.   REVIEW OF SYSTEMS: There were no specific symptoms leading to the original mammogram, which was routinely scheduled. The patient  denies unusual headaches, visual changes, nausea, vomiting, stiff neck, dizziness, or gait imbalance. There has been no cough, phlegm production, or pleurisy, no chest pain or pressure, and no change in bowel or bladder habits. The patient denies fever, rash, bleeding, unexplained fatigue or unexplained weight loss. A detailed review of systems was otherwise entirely negative.   COVID 19 VACCINATION STATUS: Patient had COVID-19 in the past; no vaccinations as of November 2021   PAST MEDICAL HISTORY: Past Medical History:  Diagnosis Date  . Breast cancer ([place]) 08/28/2020  . DDD (degenerative disc disease), cervical   . Early menopause     PAST SURGICAL HISTORY: Past Surgical History:  Procedure Laterality Date  . ABLATION  3 years ago   Endometrial   . CERVICAL DISC SURGERY     x 2  . LUMBAR DISC SURGERY    . TUBAL LIGATION      FAMILY HISTORY: Family History  Problem Relation Age of Onset  . Cancer Mother        skin  . Alcohol abuse Father   . Cancer Maternal Grandmother        breast  . Alcohol abuse Sister   The patient's father committed suicide at the age of 74 in the setting of alcohol abuse.  He was 1 of 13 siblings 1 of whom had breast cancer and 3 of whom had lung cancer.  The patient's mother is 89 years old as of November 2021.  Her mother the patient's maternal grandmother had breast cancer.  The patient also had 2 first cousins on the maternal side with breast cancer.  There are also some lung cancers on the mother's side.  The patient has 1 brother and 1 sister.  Neither has any cancers   GYNECOLOGIC HISTORY:  No LMP recorded. Patient is postmenopausal. Menarche: 54 years old Age at first  live birth: 54 years old [place] P 3 Contraceptive status post bilateral tubal ligation HRT yes as of November 2021 Hysterectomy? no BSO? 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  Her husband Chriss Czar is a  Civil engineer, contracting.  Their 40 year old daughter is currently living at home with her fianc and baby but that is temporary.  She is still in college and has worked as a Quarry manager.  Daughter Carlota Raspberry is [employer] studying public relations.  The patient's son, 64 years old, lives in [place] where he runs a Runner, broadcasting/film/video and bar    ADVANCED DIRECTIVES: In the absence of any documents to the contrary the patient's husband is her healthcare power of attorney   HEALTH MAINTENANCE: Social History   Tobacco Use  . Smoking status: Never Smoker  . Smokeless tobacco: Never Used  Substance Use Topics  . Alcohol use: Yes    Alcohol/week: 3.0 standard drinks    Types: 3 Glasses of wine per week  . Drug use: Never     Colonoscopy: Status post Cologuard 2021  PAP: 07/2020, negative  Bone density: Remote   Allergies  Allergen Reactions  . Hydrocodone-Acetaminophen Itching    Current Outpatient Medications  Medication Sig Dispense Refill  . DULoxetine (CYMBALTA) 30 MG capsule Take 30 mg by mouth daily.    Marland Kitchen estradiol (ESTRACE) 1 MG tablet Take 1 mg by mouth daily.  11  . montelukast (SINGULAIR) 10 MG tablet TAKE 1 TABLET BY MOUTH EVERYDAY AT BEDTIME 90 tablet 1  . ondansetron (ZOFRAN) 4 MG tablet Take 1 tablet (4 mg total) by mouth every 8 (eight) hours as needed for nausea or vomiting. 20 tablet 0  . progesterone (PROMETRIUM) 100 MG capsule Take 100 mg by mouth daily.    . fluticasone (FLONASE) 50 MCG/ACT nasal spray Place 2 sprays into both nostrils daily. (Patient not taking: Reported on 09/25/2020) 16 g 0  . loratadine-pseudoephedrine (CLARITIN-D 24 HOUR) 10-240 MG 24 hr tablet Take 1 tablet by mouth daily. (Patient not taking: Reported on 09/29/2020) 30 tablet 0  . tiZANidine (ZANAFLEX) 2 MG tablet TAKE 1 TABLET BY MOUTH EVERY 6-8 HOURS AS NEEDED FOR SPASM (Patient not taking: Reported on 09/25/2020)  0  . triamcinolone cream (KENALOG) 0.1 % Apply 1 application topically 2 (two) times  daily. (Patient not taking: Reported on 09/29/2020) 30 g 0   No current facility-administered medications for this visit.    OBJECTIVE: White woman in no acute distress  Vitals:   09/29/20 1537  BP: 135/80  Pulse: 66  Resp: 18  Temp: (!) 97.5 F (36.4 C)  SpO2: 99%     Body mass index is 25.85 kg/m.   Wt Readings from Last 3 Encounters:  09/29/20 145 lb 14.4 oz (66.2 kg)  09/25/20 146 lb 6.4 oz (66.4 kg)  01/08/19 149 lb (67.6 kg)      ECOG FS:1 - Symptomatic but completely ambulatory  Ocular: Sclerae unicteric, pupils round and equal Ear-nose-throat: Wearing a mask Lymphatic: No cervical or supraclavicular adenopathy Lungs no rales or rhonchi Heart regular rate and rhythm Abd soft, nontender, positive bowel sounds MSK no focal spinal tenderness, no joint edema Neuro: non-focal, well-oriented, appropriate affect Breasts: I do not palpate a mass in either breast.  There are no skin or nipple changes of concern.  Both axillae are benign.   LAB RESULTS:  CMP     Component Value Date/Time  NA 137 09/29/2020 1524   NA 139 06/28/2018 0000   K 4.1 09/29/2020 1524   CL 102 09/29/2020 1524   CO2 25 09/29/2020 1524   GLUCOSE 101 (H) 09/29/2020 1524   BUN 18 09/29/2020 1524   BUN 18 06/28/2018 0000   CREATININE 0.95 09/29/2020 1524   CREATININE 0.82 07/22/2016 1532   CALCIUM 9.2 09/29/2020 1524   PROT 7.6 09/29/2020 1524   ALBUMIN 4.0 09/29/2020 1524   AST 16 09/29/2020 1524   ALT 16 09/29/2020 1524   ALKPHOS 67 09/29/2020 1524   BILITOT 0.4 09/29/2020 1524   GFRNONAA >60 09/29/2020 1524   GFRAA  10/11/2007 2310    >60        The eGFR has been calculated using the MDRD equation. This calculation has not been validated in all clinical    No results found for: TOTALPROTELP, ALBUMINELP, A1GS, A2GS, BETS, BETA2SER, GAMS, MSPIKE, SPEI  Lab Results  Component Value Date   WBC 6.4 09/29/2020   NEUTROABS 4.0 09/29/2020   HGB 14.1 09/29/2020   HCT 42.3  09/29/2020   MCV 94.6 09/29/2020   PLT 236 09/29/2020    No results found for: LABCA2  No components found for: DRTCBU591  No results for input(s): INR in the last 168 hours.  No results found for: LABCA2  No results found for: EIK613  No results found for: HCE906  No results found for: EJF307  No results found for: CA2729  No components found for: HGQUANT  No results found for: CEA1 / No results found for: CEA1   No results found for: AFPTUMOR  No results found for: CHROMOGRNA  No results found for: KPAFRELGTCHN, LAMBDASER, KAPLAMBRATIO (kappa/lambda light chains)  No results found for: HGBA, HGBA2QUANT, HGBFQUANT, HGBSQUAN (Hemoglobinopathy evaluation)   No results found for: LDH  No results found for: IRON, TIBC, IRONPCTSAT (Iron and TIBC)  No results found for: FERRITIN  Urinalysis No results found for: COLORURINE, APPEARANCEUR, LABSPEC, PHURINE, GLUCOSEU, HGBUR, BILIRUBINUR, KETONESUR, PROTEINUR, UROBILINOGEN, NITRITE, LEUKOCYTESUR   STUDIES: No results found.   ELIGIBLE FOR AVAILABLE RESEARCH PROTOCOL: no  ASSESSMENT: 54 y.o. [place] woman status post right breast biopsy 08/28/2020 for ductal carcinoma in situ, grade 3, estrogen and progesterone receptor negative.  (1) definitive surgery pending  (2) adjuvant radiation to follow  (3) genetics testing 10/13/2020  PLAN: I met today with Normalee to review her new diagnosis. Specifically we discussed the biology of her breast cancer, its diagnosis, staging, treatment  options and prognosis. Jordayn understands that in noninvasive ductal carcinoma, also called ductal carcinoma in situ ("DCIS") the breast cancer cells remain trapped in the ducts were they started. They cannot travel to a vital organ. For that reason these cancers in themselves are not life-threatening.  If the whole breast is removed then all the ducts are removed and since the cancer cells are trapped in the ducts, the cure rate with  mastectomy for noninvasive breast cancer is approximately 99%. Nevertheless we recommend lumpectomy, because there is no survival advantage to mastectomy and because the cosmetic result is generally superior with breast conservation.  Since the patient is keeping her breast, there will be some risk of recurrence. The recurrence can only be in the same breast since, again, the cells are trapped in the ducts. There is no connection from one breast to the other. The risk of local recurrence is cut by more than half with radiation, which is standard in this situation.  In estrogen receptor positive cancers anti-estrogens  can also be considered.  However when dealing with estrogen and progesterone receptor negative breast cancers antiestrogens have no role.   The patient does qualify for genetics testing. In patients who carry a deleterious mutation [for example in a  BRCA gene], the risk of a new breast cancer developing in the future may be sufficiently great that the patient may choose bilateral mastectomies. However if she wishes to keep her breasts in that situation it is safe to do so. That would require intensified screening, which generally means not only yearly mammography but a yearly breast MRI as well.   Accordingly as far as this breast cancer is concerned the plan is for surgery followed by radiation.  We discussed the fact that hormone replacement therapy with estrogen alone does not increase the risk of breast cancer but does increase the risk of endometrial cancer.  Hormone replacement therapy with both estrogen and progesterone does not increase the risk of endometrial cancer but does increase the risk of breast cancer.  The patient can either go off hormone replacement therapy go through menopause (which has been extremely difficult for women in her family), continue as she is doing and accept a higher risk of breast cancer, switch to estrogen alone and either accept a higher risk of  endometrial cancer or have a hysterectomy which will eliminate that risk.  She is aware that estrogen replacement also increases blood clotting risk and makes the breasts more dense and mammograms less informative.  She will be following up on this discussion at her discretion with her gynecologist.  Even though she has had Covid and therefore has some immunity my recommendation was that sometime after she completes radiation she proceed to vaccination and specifically suggested [place] x2  Huda has a good understanding of the overall plan. She agrees with it. She knows the goal of treatment in her case is cure.  In the absence of any need for systemic therapy I do not have a further role in her treatment but I will be glad to see her at any point in the future if and when the need arises.  At this point we are not making any further routine appointments for her here   Virgie Dad. Pranit Owensby, MD 09/29/2020 4:49 PM Medical Oncology and Hematology [facility] [place], [place] [address] Tel. [phone]    Fax. [phone]   This document serves as a record of services personally performed by Lurline Del, MD. It was created on his behalf by Wilburn Mylar, a trained medical scribe. The creation of this record is based on the scribe's personal observations and the provider's statements to them.   I, Lurline Del MD, have reviewed the above documentation for accuracy and completeness, and I agree with the above.    *Total Encounter Time as defined by the Centers for Medicare and Medicaid Services includes, in addition to the face-to-face time of a patient visit (documented in the note above) non-face-to-face time: obtaining and reviewing outside history, ordering and reviewing medications, tests or procedures, care coordination (communications with other health care professionals or caregivers) and documentation in the medical record.

## 2020-09-29 ENCOUNTER — Other Ambulatory Visit: Payer: Self-pay

## 2020-09-29 ENCOUNTER — Inpatient Hospital Stay: Payer: BC Managed Care – PPO | Attending: Oncology | Admitting: Oncology

## 2020-09-29 ENCOUNTER — Inpatient Hospital Stay: Payer: BC Managed Care – PPO

## 2020-09-29 DIAGNOSIS — Z801 Family history of malignant neoplasm of trachea, bronchus and lung: Secondary | ICD-10-CM | POA: Diagnosis not present

## 2020-09-29 DIAGNOSIS — Z803 Family history of malignant neoplasm of breast: Secondary | ICD-10-CM | POA: Diagnosis not present

## 2020-09-29 DIAGNOSIS — Z808 Family history of malignant neoplasm of other organs or systems: Secondary | ICD-10-CM | POA: Insufficient documentation

## 2020-09-29 DIAGNOSIS — Z79899 Other long term (current) drug therapy: Secondary | ICD-10-CM | POA: Diagnosis not present

## 2020-09-29 DIAGNOSIS — H902 Conductive hearing loss, unspecified: Secondary | ICD-10-CM

## 2020-09-29 DIAGNOSIS — M5030 Other cervical disc degeneration, unspecified cervical region: Secondary | ICD-10-CM | POA: Diagnosis not present

## 2020-09-29 DIAGNOSIS — D0511 Intraductal carcinoma in situ of right breast: Secondary | ICD-10-CM | POA: Insufficient documentation

## 2020-09-29 DIAGNOSIS — Z170 Estrogen receptor positive status [ER+]: Secondary | ICD-10-CM | POA: Diagnosis not present

## 2020-09-29 LAB — CMP (CANCER CENTER ONLY)
ALT: 16 U/L (ref 0–44)
AST: 16 U/L (ref 15–41)
Albumin: 4 g/dL (ref 3.5–5.0)
Alkaline Phosphatase: 67 U/L (ref 38–126)
Anion gap: 10 (ref 5–15)
BUN: 18 mg/dL (ref 6–20)
CO2: 25 mmol/L (ref 22–32)
Calcium: 9.2 mg/dL (ref 8.9–10.3)
Chloride: 102 mmol/L (ref 98–111)
Creatinine: 0.95 mg/dL (ref 0.44–1.00)
GFR, Estimated: >60 mL/min (ref >60)
Glucose, Bld: 101 mg/dL — ABNORMAL HIGH (ref 70–99)
Potassium: 4.1 mmol/L (ref 3.5–5.1)
Sodium: 137 mmol/L (ref 135–145)
Total Bilirubin: 0.4 mg/dL (ref 0.3–1.2)
Total Protein: 7.6 g/dL (ref 6.5–8.1)

## 2020-09-29 LAB — CBC WITH DIFFERENTIAL (CANCER CENTER ONLY)
Abs Immature Granulocytes: 0.01 10*3/uL (ref 0.00–0.07)
Basophils Absolute: 0 10*3/uL (ref 0.0–0.1)
Basophils Relative: 1 %
Eosinophils Absolute: 0 10*3/uL (ref 0.0–0.5)
Eosinophils Relative: 1 %
HCT: 42.3 % (ref 36.0–46.0)
Hemoglobin: 14.1 g/dL (ref 12.0–15.0)
Immature Granulocytes: 0 %
Lymphocytes Relative: 27 %
Lymphs Abs: 1.7 10*3/uL (ref 0.7–4.0)
MCH: 31.5 pg (ref 26.0–34.0)
MCHC: 33.3 g/dL (ref 30.0–36.0)
MCV: 94.6 fL (ref 80.0–100.0)
Monocytes Absolute: 0.6 10*3/uL (ref 0.1–1.0)
Monocytes Relative: 10 %
Neutro Abs: 4 10*3/uL (ref 1.7–7.7)
Neutrophils Relative %: 61 %
Platelet Count: 236 10*3/uL (ref 150–400)
RBC: 4.47 MIL/uL (ref 3.87–5.11)
RDW: 12.6 % (ref 11.5–15.5)
WBC Count: 6.4 10*3/uL (ref 4.0–10.5)
nRBC: 0 % (ref 0.0–0.2)

## 2020-09-30 ENCOUNTER — Encounter: Payer: Self-pay | Admitting: Licensed Clinical Social Worker

## 2020-09-30 ENCOUNTER — Telehealth: Payer: Self-pay | Admitting: Oncology

## 2020-09-30 ENCOUNTER — Encounter: Payer: Self-pay | Admitting: *Deleted

## 2020-09-30 DIAGNOSIS — D0511 Intraductal carcinoma in situ of right breast: Secondary | ICD-10-CM

## 2020-09-30 NOTE — Telephone Encounter (Signed)
No 11/15 los. No changes made to pt's schedule.

## 2020-09-30 NOTE — Progress Notes (Signed)
[facility] Clinical Social Work  Initial Assessment   Lori [name] is a 54 y.o. year old female. Clinical Social Work was referred by Distress screen for assessment of psychosocial needs.   SDOH (Social Determinants of Health) assessments performed: Yes SDOH Interventions     Most Recent Value  SDOH Interventions  Food Insecurity Interventions Intervention Not Indicated  Financial Strain Interventions Intervention Not Indicated  Housing Interventions Intervention Not Indicated  Transportation Interventions Intervention Not Indicated      Distress Screen completed: Yes ONCBCN DISTRESS SCREENING 09/25/2020  Screening Type Initial Screening  Distress experienced in past week (1-10) 8  Practical problem type Work/school  Family Problem type Other (comment)  Emotional problem type Nervousness/Anxiety;Adjusting to illness  Physical Problem type Pain;Nausea/vomiting  Other Call cell [phone]      Family/Social Information:   Housing Arrangement: patient lives with husband. 5yo daughter, her fiance and 3 mo baby are currently staying with pt. 35yo daughter will be back soon from college in [place] for break.  Family members/support persons in your life? Family and Friends/Colleagues  Transportation concerns: no   Employment: Working full time as a Pharmacist, hospital. Income source: Employment and husband's employment  Financial concerns: No o Type of concern: None  Food access concerns: no  Services Currently in place:  n/a  Coping/ Adjustment to diagnosis:  Patient understands treatment plan and what happens next? yes, feels better after speaking with Dr. Jana Hakim and receiving more information and clarification  Patient reported stressors: Adjusting to my illness  Current coping skills/ strengths: Average or above average intelligence, Capable of independent living, Financial means, Motivation for treatment/growth and Supportive family/friends    SUMMARY: Current SDOH  Barriers:   None noted today  Clinical Social Work Clinical Goal(s):   Patient will continue to utilize her support system and attend medical appts  Interventions:  Discussed the importance of support during treatment  Informed patient of the support team roles and support services at [facility]  Provided CSW contact information and encouraged patient to call with any questions or concerns   Follow Up Plan: Patient will contact CSW with any support or resource needs Patient verbalizes understanding of plan: Yes    Christeen Douglas , LCSW

## 2020-10-01 ENCOUNTER — Other Ambulatory Visit: Payer: Self-pay | Admitting: Surgery

## 2020-10-01 DIAGNOSIS — D0511 Intraductal carcinoma in situ of right breast: Secondary | ICD-10-CM

## 2020-10-01 NOTE — Progress Notes (Signed)
Radiation Oncology         [phone] ________________________________  Name: Lori [name] MRN: [id number]  Date: 09/25/2020  DOB: July 31, 1966  SW:NIOEVO, Aundra Millet, MD  Erroll Luna, MD     REFERRING PHYSICIAN: Erroll Luna, MD   DIAGNOSIS: The encounter diagnosis was Ductal carcinoma in situ (DCIS) of right breast.   HISTORY OF PRESENT ILLNESS::Lori [name] is a 54 y.o. female who is seen for an initial consultation visit regarding the patient's diagnosis of right-sided breast cancer.  The patient was found to have a small suspicious abnormality on recent screening mammogram.  This measured 0.5 cm.  Biopsy was completed and this returned positive for a grade 3 DCIS.  Receptor studies have indicated that the tumor is estrogen receptor negative and progesterone receptor negative.  She has met with surgery and a right-sided lumpectomy has been recommended for her.  I have therefore been asked to see the patient for consideration of adjuvant radiation treatment subsequently.    PREVIOUS RADIATION THERAPY: No   PAST MEDICAL HISTORY:  has a past medical history of Breast cancer ([place]) (08/28/2020), DDD (degenerative disc disease), cervical, and Early menopause.     PAST SURGICAL HISTORY: Past Surgical History:  Procedure Laterality Date  . ABLATION  3 years ago   Endometrial   . CERVICAL DISC SURGERY     x 2  . LUMBAR DISC SURGERY    . TUBAL LIGATION       FAMILY HISTORY: family history includes Alcohol abuse in her father and sister; Cancer in her maternal grandmother and mother.   SOCIAL HISTORY:  reports that she has never smoked. She has never used smokeless tobacco. She reports current alcohol use of about 3.0 standard drinks of alcohol per week. She reports that she does not use drugs.   ALLERGIES: Hydrocodone-acetaminophen   MEDICATIONS:  Current Outpatient Medications  Medication Sig Dispense Refill  . DULoxetine (CYMBALTA) 30 MG capsule Take 30 mg by  mouth daily.    Marland Kitchen estradiol (ESTRACE) 1 MG tablet Take 1 mg by mouth daily.  11  . loratadine-pseudoephedrine (CLARITIN-D 24 HOUR) 10-240 MG 24 hr tablet Take 1 tablet by mouth daily. (Patient not taking: Reported on 09/29/2020) 30 tablet 0  . montelukast (SINGULAIR) 10 MG tablet TAKE 1 TABLET BY MOUTH EVERYDAY AT BEDTIME 90 tablet 1  . ondansetron (ZOFRAN) 4 MG tablet Take 1 tablet (4 mg total) by mouth every 8 (eight) hours as needed for nausea or vomiting. 20 tablet 0  . progesterone (PROMETRIUM) 100 MG capsule Take 100 mg by mouth daily.    Marland Kitchen triamcinolone cream (KENALOG) 0.1 % Apply 1 application topically 2 (two) times daily. (Patient not taking: Reported on 09/29/2020) 30 g 0  . fluticasone (FLONASE) 50 MCG/ACT nasal spray Place 2 sprays into both nostrils daily. (Patient not taking: Reported on 09/25/2020) 16 g 0  . tiZANidine (ZANAFLEX) 2 MG tablet TAKE 1 TABLET BY MOUTH EVERY 6-8 HOURS AS NEEDED FOR SPASM (Patient not taking: Reported on 09/25/2020)  0   No current facility-administered medications for this encounter.     REVIEW OF SYSTEMS:  A 15 point review of systems is documented in the electronic medical record. This was obtained by the nursing staff. However, I reviewed this with the patient to discuss relevant findings and make appropriate changes.  Pertinent items are noted in HPI.    PHYSICAL EXAM:  height is $RemoveB'5\' 3"'fBhHwrfm$  (1.6 m) and weight is 146 lb 6.4 oz (66.4 kg).  Her temperature is 97.7 F (36.5 C). Her blood pressure is 143/69 (abnormal) and her pulse is 69. Her respiration is 18 and oxygen saturation is 99%.   ECOG = 0  0 - Asymptomatic (Fully active, able to carry on all predisease activities without restriction)  1 - Symptomatic but completely ambulatory (Restricted in physically strenuous activity but ambulatory and able to carry out work of a light or sedentary nature. For example, light housework, office work)  2 - Symptomatic, <50% in bed during the day (Ambulatory  and capable of all self care but unable to carry out any work activities. Up and about more than 50% of waking hours)  3 - Symptomatic, >50% in bed, but not bedbound (Capable of only limited self-care, confined to bed or chair 50% or more of waking hours)  4 - Bedbound (Completely disabled. Cannot carry on any self-care. Totally confined to bed or chair)  5 - Death   Eustace Pen MM, Creech RH, Tormey DC, et al. [phone]). "Toxicity and response criteria of the [facility] Group". [place] Oncol. 5 (6): 649-55  Alert, no distress   LABORATORY DATA:  Lab Results  Component Value Date   WBC 6.4 09/29/2020   HGB 14.1 09/29/2020   HCT 42.3 09/29/2020   MCV 94.6 09/29/2020   PLT 236 09/29/2020   Lab Results  Component Value Date   NA 137 09/29/2020   K 4.1 09/29/2020   CL 102 09/29/2020   CO2 25 09/29/2020   Lab Results  Component Value Date   ALT 16 09/29/2020   AST 16 09/29/2020   ALKPHOS 67 09/29/2020   BILITOT 0.4 09/29/2020      RADIOGRAPHY: No results found.     IMPRESSION/ PLAN:  The patient was seen today for her recent diagnosis of grade 3 DCIS of the right breast.  The tumor is receptor negative.  She is proceeding with a lumpectomy and we therefore discussed the recommendation to subsequently proceed with a course of adjuvant radiation treatment.  We discussed the rationale of treatment as well as potential side effects and risks.  All of her questions were answered today.  We would anticipate treating the patient most likely with a hypofractionated course of radiation treatment over 4 weeks to the right breast.  We look forward to seeing the patient postoperatively to further review her case at that time and to further coordinate her care.  The patient was seen in person today in clinic.  The total time spent on the patient's visit today was 40 minutes, including chart review, direct discussion/evaluation with the patient, and coordination of  care.         ________________________________   Jodelle Gross, MD, PhD   **Disclaimer: This note was dictated with voice recognition software. Similar sounding words can inadvertently be transcribed and this note may contain transcription errors which may not have been corrected upon publication of note.**

## 2020-10-13 ENCOUNTER — Inpatient Hospital Stay (HOSPITAL_BASED_OUTPATIENT_CLINIC_OR_DEPARTMENT_OTHER): Payer: BC Managed Care – PPO | Admitting: Genetic Counselor

## 2020-10-13 ENCOUNTER — Encounter: Payer: BC Managed Care – PPO | Admitting: Genetic Counselor

## 2020-10-13 ENCOUNTER — Other Ambulatory Visit: Payer: Self-pay

## 2020-10-13 ENCOUNTER — Encounter: Payer: Self-pay | Admitting: Genetic Counselor

## 2020-10-13 ENCOUNTER — Other Ambulatory Visit: Payer: Self-pay | Admitting: Genetic Counselor

## 2020-10-13 ENCOUNTER — Inpatient Hospital Stay: Payer: BC Managed Care – PPO

## 2020-10-13 DIAGNOSIS — D0511 Intraductal carcinoma in situ of right breast: Secondary | ICD-10-CM

## 2020-10-13 DIAGNOSIS — Z803 Family history of malignant neoplasm of breast: Secondary | ICD-10-CM

## 2020-10-13 DIAGNOSIS — Z801 Family history of malignant neoplasm of trachea, bronchus and lung: Secondary | ICD-10-CM

## 2020-10-13 DIAGNOSIS — Z808 Family history of malignant neoplasm of other organs or systems: Secondary | ICD-10-CM

## 2020-10-13 LAB — GENETIC SCREENING ORDER

## 2020-10-13 NOTE — Progress Notes (Signed)
REFERRING PROVIDER: Erroll Luna, MD [address] [place],  [place] [address]  PRIMARY PROVIDER:  Midge Minium, MD  PRIMARY REASON FOR VISIT:  1. Ductal carcinoma in situ (DCIS) of right breast   2. Family history of breast cancer   3. Family history of lung cancer   4. Family history of skin cancer    HISTORY OF PRESENT ILLNESS:   Lori [name], a 54 y.o. female, was seen for a [facility] cancer genetics consultation at the request of Dr. Brantley Stage due to a personal and family history of breast cancer.  Lori [name] presents to clinic today to discuss the possibility of a hereditary predisposition to cancer, to discuss genetic testing, and to further clarify her future cancer risks, as well as potential cancer risks for family members.   In 2021, at the age of 16, Lori [name] was diagnosed with ductal carcinoma in situ of the right breast. The preliminary treatment plan includes lumpectomy and adjuvant radiation.   CANCER HISTORY:  Oncology History  Ductal carcinoma in situ (DCIS) of right breast  09/29/2020 Initial Diagnosis   Ductal carcinoma in situ (DCIS) of right breast   09/29/2020 Cancer Staging   Staging form: Breast, AJCC 8th Edition - Clinical: Stage 0 (cTis (DCIS), cN0, cM0, ER-, PR-) - Signed by Chauncey Cruel, MD on 09/29/2020    RISK FACTORS:  Menarche was at age 52.  First live birth at age 17.  OCP use for approximately more than 15 years.  Ovaries intact: yes.  Hysterectomy: no.  Menopausal status: postmenopausal; menopause in early 32s HRT use: ~15 years (combined estrogen and progesterone per patient) Colonoscopy: no; normal fecal occult blood test in 2021. Mammogram within the last year: yes. Number of breast biopsies: 1. Up to date with pelvic exams: yes; most recent 07/2020 Any excessive radiation exposure in the past: no  Past Medical History:  Diagnosis Date   Breast cancer ([facility]) 08/28/2020   DDD (degenerative disc disease),  cervical    Early menopause     Past Surgical History:  Procedure Laterality Date   ABLATION  3 years ago   Endometrial    CERVICAL DISC SURGERY     x 2   LUMBAR DISC SURGERY     TUBAL LIGATION      Social History   Socioeconomic History   Marital status: Married    Spouse name: Not on file   Number of children: 3   Years of education: Not on file   Highest education level: Not on file  Occupational History   Occupation: Teacher  Tobacco Use   Smoking status: Light Tobacco Smoker    Types: Cigarettes   Smokeless tobacco: Never Used   Tobacco comment: reports ~2-4 cigarettes/week  Substance and Sexual Activity   Alcohol use: Yes    Alcohol/week: 3.0 standard drinks    Types: 3 Glasses of wine per week   Drug use: Never   Sexual activity: Yes  Other Topics Concern   Not on file  Social History Narrative   Grandchild on the way.     Social Determinants of Health   Financial Resource Strain: Low Risk    Difficulty of Paying Living Expenses: Not hard at all  Food Insecurity: No Food Insecurity   Worried About Charity fundraiser in the Last Year: Never true   Ran Out of Food in the Last Year: Never true  Transportation Needs: No Transportation Needs   Lack of Transportation (  Medical): No   Lack of Transportation (Non-Medical): No  Physical Activity:    Days of Exercise per Week: Not on file   Minutes of Exercise per Session: Not on file  Stress:    Feeling of Stress : Not on file  Social Connections:    Frequency of Communication with Friends and Family: Not on file   Frequency of Social Gatherings with Friends and Family: Not on file   Attends Religious Services: Not on file   Active Member of Clubs or Organizations: Not on file   Attends Archivist Meetings: Not on file   Marital Status: Not on file     FAMILY HISTORY:  We obtained a detailed, 4-generation family history.  Significant diagnoses are listed  below: Family History  Problem Relation Age of Onset   Skin cancer Mother        unknown age of onset   Breast cancer Maternal Grandmother        dx before 3   Skin cancer Brother        dx 4s; face   Lung cancer Maternal Aunt        dx 53s   Lung cancer Maternal Uncle        dx 53s   Breast cancer Paternal Aunt        dx unknown age   Lung cancer Paternal Uncle        x3   Breast cancer Other        distant maternal cousin; dx 60s   Breast cancer Cousin        maternal cousin, bilateral?, dx 30s     Lori [name] has one son (age 69) and two daughters (ages 4 and 67), all without a cancer history.  Lori [name] has one brother, who has a history of skin cancer in his 37s, and one sister, who is 37 years old.    Lori [name]'s mother is 62 and has a history of skin cancer.  Lori [name]'s maternal aunt and uncle have a history of lung cancer in their 50s.  Lori [name] has a maternal cousin with possible bilateral breast cancer, diagnosed in her 77s.  Lori [name]'s maternal grandmother was diagnosed with breast cancer and passed away before the age of 47.  Lori [name] also has a distant maternal cousin with breast cancer diagnosed in her 47s.  No other maternal family history of cancer was reported.   Lori [name]'s father passed away at age 98 and did not have cancer.  Lori [name]'s paternal aunt had breast cancer at an unknown age and passed away at 9.  No other paternal family history of cancer was reported.   Lori [name] is unaware of previous family history of genetic testing for hereditary cancer risks. Patient's maternal ancestors are of [place] and [place] descent, and paternal ancestors are of [place] descent. There is no reported Ashkenazi Jewish ancestry. There is no known consanguinity.  GENETIC COUNSELING ASSESSMENT: Lori [name] is a 54 y.o. female with a personal and family history of breast cancer which is somewhat suggestive of a hereditary cancer syndrome, such as  Hereditary Breast and Ovarian Cancer Syndrome, and predisposition to cancer given the related diagnoses in the family. We, therefore, discussed and recommended the following at today's visit.   DISCUSSION: We discussed that 5 - 10% of cancer is hereditary, with most cases of hereditary breast cancer associated with mutations in BRCA1 or BRCA2.  There are other genes that can be associated  with hereditary breast cancer syndromes.  These include but are not limited to [place], ATM, and CHEK2.  We discussed that testing is beneficial for several reasons including knowing how to follow individuals after completing their treatment, identifying whether potential treatment options would be beneficial, and understanding if other family members could be at risk for cancer and allowing them to undergo genetic testing.   We reviewed the characteristics, features and inheritance patterns of hereditary cancer syndromes. We also discussed genetic testing, including the appropriate family members to test, the process of testing, insurance coverage and turn-around-time for results. We discussed the implications of a negative, positive and/or variant of uncertain significant result. In order to get genetic test results in a timely manner so that Lori [name] can use these genetic test results for surgical decisions, we recommended Lori [name] pursue genetic testing for the Breast Cancer STAT Panel.  The STAT Breast cancer panel offered by [facility] includes sequencing and rearrangement analysis for the following 9 genes:  ATM, BRCA1, BRCA2, CDH1, CHEK2, PALB2, PTEN, STK11 and TP53.   Once complete, we recommend Lori [name] pursue reflex genetic testing to a more comprehensive gene panel.  Lori [name]  was offered a common hereditary cancer panel (48 genes) and an expanded pan-cancer panel (85 genes). Lori [name] was informed of the benefits and limitations of each panel, including that expanded pan-cancer panels contain several  preliminary evidence genes that do not have clear management guidelines at this point in time.  We also discussed that as the number of genes included on a panel increases, the chances of variants of uncertain significance increases.  After considering the benefits and limitations of each gene panel, Lori [name]. Wille  elected to have a common hereditary cancer panel through [facility].  The Common Hereditary Cancers Panel offered by [facility] includes sequencing and/or deletion duplication testing of the following 48 genes: APC, ATM, AXIN2, BARD1, BMPR1A, BRCA1, BRCA2, BRIP1, CDH1, CDK4, CDKN2A (p14ARF), CDKN2A (p16INK4a), CHEK2, CTNNA1, DICER1, EPCAM (Deletion/duplication testing only), GREM1 (promoter region deletion/duplication testing only), KIT, MEN1, MLH1, MSH2, MSH3, MSH6, MUTYH, NBN, NF1, NHTL1, PALB2, PDGFRA, PMS2, POLD1, POLE, PTEN, RAD50, RAD51C, RAD51D, RNF43, SDHB, SDHC, SDHD, SMAD4, SMARCA4. STK11, TP53, TSC1, TSC2, and VHL.  The following genes were evaluated for sequence changes only: SDHA and HOXB13 c.251G>A variant only.  Based on Lori [name]. Allegretto's personal and family history of cancer, she meets medical criteria for genetic testing. Despite that she meets criteria, she may still have an out of pocket cost.   PLAN: After considering the risks, benefits, and limitations, Lori [name]. [place] provided informed consent to pursue genetic testing and the blood sample was sent to [facility] for analysis of the STAT + Common Hereditary Cancer Panel. Results should be available within approximately 1-2 weeks' time, at which point they will be disclosed by telephone to Lori [name]. [place], as will any additional recommendations warranted by these results. Lori [name]. Brugger will receive a summary of her genetic counseling visit and a copy of her results once available. This information will also be available in Epic.   Lastly, we encouraged Lori [name]. Braaten to remain in contact with cancer genetics annually so that we can  continuously update the family history and inform her of any changes in cancer genetics and testing that may be of benefit for this family.   Lori [name]. Pring's questions were answered to her satisfaction today. Our contact information was provided should additional questions or concerns arise. Thank you for the referral and allowing [place] to share in the  care of your patient.   Jerri Hargadon M. Joette Catching, [place], [facility] Certified Film/video [email or web address] (P) [phone]  The patient was seen for a total of 30 minutes in face-to-face genetic counseling.  This patient was discussed with Drs. Magrinat, Lindi Adie and/or Burr Medico who agrees with the above.    _______________________________________________________________________ For Office Staff:  Number of people involved in session: 1 Was an Intern/ student involved with case: no

## 2020-10-21 ENCOUNTER — Ambulatory Visit: Payer: Self-pay | Admitting: Genetic Counselor

## 2020-10-21 ENCOUNTER — Telehealth: Payer: Self-pay | Admitting: Genetic Counselor

## 2020-10-21 ENCOUNTER — Encounter: Payer: Self-pay | Admitting: Genetic Counselor

## 2020-10-21 DIAGNOSIS — Z803 Family history of malignant neoplasm of breast: Secondary | ICD-10-CM

## 2020-10-21 DIAGNOSIS — Z801 Family history of malignant neoplasm of trachea, bronchus and lung: Secondary | ICD-10-CM

## 2020-10-21 DIAGNOSIS — Z808 Family history of malignant neoplasm of other organs or systems: Secondary | ICD-10-CM

## 2020-10-21 DIAGNOSIS — Z1379 Encounter for other screening for genetic and chromosomal anomalies: Secondary | ICD-10-CM

## 2020-10-21 DIAGNOSIS — D0511 Intraductal carcinoma in situ of right breast: Secondary | ICD-10-CM

## 2020-10-21 NOTE — Telephone Encounter (Addendum)
Revealed negative genetic testing.  Discussed that we do not know why she has DCIS or why there is cancer in the family. It could be sporadic, due to a different gene that we are not testing, or maybe our current technology may not be able to pick something up.  It will be important for her to keep in contact with genetics to keep up with whether additional testing may be needed.   

## 2020-10-21 NOTE — Progress Notes (Signed)
HPI:  Lori [name] was previously seen in the [facility] clinic due to a personal and family history of cancer and concerns regarding a hereditary predisposition to cancer. Please refer to our prior cancer genetics clinic note for more information regarding our discussion, assessment and recommendations, at the time. Lori [name]'s recent genetic test results were disclosed to her, as were recommendations warranted by these results. These results and recommendations are discussed in more detail below.  CANCER HISTORY:  In 2021, at the age of 54, Lori [name] was diagnosed with ductal carcinoma in situ of the right breast. The preliminary treatment plan includes lumpectomy and adjuvant radiation.  Oncology History  Ductal carcinoma in situ (DCIS) of right breast  09/29/2020 Initial Diagnosis   Ductal carcinoma in situ (DCIS) of right breast   09/29/2020 Cancer Staging   Staging form: Breast, AJCC 8th Edition - Clinical: Stage 0 (cTis (DCIS), cN0, cM0, ER-, PR-) - Signed by Chauncey Cruel, MD on 09/29/2020   10/20/2020 Genetic Testing   Negative genetic testing: no pathogenic variants detected in [facility] Common Hereditary Cancers Panel.  The report date is October 20, 2020.   The Common Hereditary Cancers Panel offered by [facility] includes sequencing and/or deletion duplication testing of the following 48 genes: APC, ATM, AXIN2, BARD1, BMPR1A, BRCA1, BRCA2, BRIP1, CDH1, CDK4, CDKN2A (p14ARF), CDKN2A (p16INK4a), CHEK2, CTNNA1, DICER1, EPCAM (Deletion/duplication testing only), GREM1 (promoter region deletion/duplication testing only), KIT, MEN1, MLH1, MSH2, MSH3, MSH6, MUTYH, NBN, NF1, NHTL1, PALB2, PDGFRA, PMS2, POLD1, POLE, PTEN, RAD50, RAD51C, RAD51D, RNF43, SDHB, SDHC, SDHD, SMAD4, SMARCA4. STK11, TP53, TSC1, TSC2, and VHL.  The following genes were evaluated for sequence changes only: SDHA and HOXB13 c.251G>A variant only.     FAMILY HISTORY:  We obtained a detailed,  4-generation family history.  Significant diagnoses are listed below: Family History  Problem Relation Age of Onset  . Skin cancer Mother        unknown age of onset  . Breast cancer Maternal Grandmother        dx before 60  . Skin cancer Brother        dx 10s; face  . Lung cancer Maternal Aunt        dx 61s  . Lung cancer Maternal Uncle        dx 61s  . Breast cancer Paternal Aunt        dx unknown age  . Lung cancer Paternal Uncle        x3  . Breast cancer Other        distant maternal cousin; dx 36s  . Breast cancer Cousin        maternal cousin, bilateral?, dx 83s     Lori [name] has one son (age 1) and two daughters (ages 61 and 28), all without a cancer history.  Lori [name] has one brother, who has a history of skin cancer in his 32s, and one sister, who is 85 years old.    Lori [name]'s mother is 25 and has a history of skin cancer.  Lori [name]'s maternal aunt and uncle have a history of lung cancer in their 44s.  Lori [name] has a maternal cousin with possible bilateral breast cancer, diagnosed in her 40s.  Lori [name]'s maternal grandmother was diagnosed with breast cancer and passed away before the age of 57.  Lori [name] also has a distant maternal cousin with breast cancer diagnosed in her 91s.  No other maternal family history of cancer was  reported.   Lori [name]'s father passed away at age 75 and did not have cancer.  Lori [name]'s paternal aunt had breast cancer at an unknown age and passed away at 68.  No other paternal family history of cancer was reported.   Lori [name] is unaware of previous family history of genetic testing for hereditary cancer risks. Patient's maternal ancestors are of [place] and [place] descent, and paternal ancestors are of [place] descent. There is no reported Ashkenazi Jewish ancestry. There is no known consanguinity.  GENETIC TEST RESULTS: Genetic testing reported out on October 20, 2020.  The [facility] Common Hereditary Cancers Panel  found no pathogenic mutations. The Common Hereditary Cancers Panel offered by [facility] includes sequencing and/or deletion duplication testing of the following 48 genes: APC, ATM, AXIN2, BARD1, BMPR1A, BRCA1, BRCA2, BRIP1, CDH1, CDK4, CDKN2A (p14ARF), CDKN2A (p16INK4a), CHEK2, CTNNA1, DICER1, EPCAM (Deletion/duplication testing only), GREM1 (promoter region deletion/duplication testing only), KIT, MEN1, MLH1, MSH2, MSH3, MSH6, MUTYH, NBN, NF1, NHTL1, PALB2, PDGFRA, PMS2, POLD1, POLE, PTEN, RAD50, RAD51C, RAD51D, RNF43, SDHB, SDHC, SDHD, SMAD4, SMARCA4. STK11, TP53, TSC1, TSC2, and VHL.  The following genes were evaluated for sequence changes only: SDHA and HOXB13 c.251G>A variant only.  The test report has been scanned into EPIC and is located under the Molecular Pathology section of the Results Review tab.  A portion of the result report is included below for reference.     We discussed with Lori [name] that because current genetic testing is not perfect, it is possible there may be a gene mutation in one of these genes that current testing cannot detect, but that chance is small.  We also discussed, that there could be another gene that has not yet been discovered, or that we have not yet tested, that is responsible for the cancer diagnoses in the family. It is also possible there is a hereditary cause for the cancer in the family that Lori [name] did not inherit and therefore was not identified in her testing.  Therefore, it is important to remain in touch with cancer genetics in the future so that we can continue to offer Lori [name] the most up to date genetic testing.   ADDITIONAL GENETIC TESTING: We discussed with Lori [name] that there are other genes that are associated with increased cancer risk that can be analyzed. Should Lori [name] wish to pursue additional genetic testing, we are happy to discuss and coordinate this testing, at any time.    CANCER SCREENING RECOMMENDATIONS: Ms. Staples's test  result is considered negative (normal).  This means that we have not identified a hereditary cause for her personal and family history of cancer at this time. Most cancers happen by chance and this negative test suggests that her cancer may fall into this category.    While reassuring, this does not definitively rule out a hereditary predisposition to cancer. It is still possible that there could be genetic mutations that are undetectable by current technology. There could be genetic mutations in genes that have not been tested or identified to increase cancer risk.  Therefore, it is recommended she continue to follow the cancer management and screening guidelines provided by her oncology and primary healthcare provider.   An individual's cancer risk and medical management are not determined by genetic test results alone. Overall cancer risk assessment incorporates additional factors, including personal medical history, family history, and any available genetic information that may result in a personalized plan for cancer prevention and surveillance  RECOMMENDATIONS FOR FAMILY MEMBERS:  Individuals in this family might be at some increased risk of developing cancer, over the general population risk, simply due to the family history of cancer.  We recommended women in this family have a yearly mammogram beginning at age 13, or 90 years younger than the earliest onset of cancer, an annual clinical breast exam, and perform monthly breast self-exams. Women in this family should also have a gynecological exam as recommended by their primary provider. All family members should be referred for colonoscopy starting at age 50.  It is also possible there is a hereditary cause for the cancer in Ms. Sheppard's family that she did not inherit and therefore was not identified in her.  Based on Ms. Rodger's family history of her maternal grandmother's diagnosis of breast cancer before the age of 3, we recommended first  degree relatives of her maternal grandmother to have genetic counseling and testing. Ms. Casady will let us know if we can be of any assistance in coordinating genetic counseling and/or testing for this family member.   FOLLOW-UP: Lastly, we discussed with Ms. Apostol that cancer genetics is a rapidly advancing field and it is possible that new genetic tests will be appropriate for her and/or her family members in the future. We encouraged her to remain in contact with cancer genetics on an annual basis so we can update her personal and family histories and let her know of advances in cancer genetics that may benefit this family.   Our contact number was provided. Ms. Kohrs's questions were answered to her satisfaction, and she knows she is welcome to call us at anytime with additional questions or concerns.   Nishtha Raider M. Joette Catching, [place], [facility] Certified Film/video [email or web address] (P) [phone]

## 2020-10-28 ENCOUNTER — Encounter: Payer: Self-pay | Admitting: *Deleted

## 2020-11-03 ENCOUNTER — Telehealth (INDEPENDENT_AMBULATORY_CARE_PROVIDER_SITE_OTHER): Payer: BC Managed Care – PPO | Admitting: Physician Assistant

## 2020-11-03 DIAGNOSIS — J019 Acute sinusitis, unspecified: Secondary | ICD-10-CM | POA: Diagnosis not present

## 2020-11-03 DIAGNOSIS — B9789 Other viral agents as the cause of diseases classified elsewhere: Secondary | ICD-10-CM | POA: Diagnosis not present

## 2020-11-03 NOTE — Progress Notes (Signed)
Virtual Visit via Video   I connected with patient on 11/03/20 at  1:30 PM EST by a video enabled telemedicine application and verified that I am speaking with the correct person using two identifiers.  Location patient: Home Location provider: [facility], Office Persons participating in the virtual visit: Patient, Provider, [place] (Patina Moore)  I discussed the limitations of evaluation and management by telemedicine and the availability of in person appointments. The patient expressed understanding and agreed to proceed.  Subjective:   HPI:   Patient presents via [facility] today c/o 1 week of sinus pressure and sinus congestion. Denies fever, chills, nausea or vomiting, loss of taste or smell. Denies chest congestion, cough or SOB. Denies recent travel or sick contact. Has been taking Tylenol cold and sinus and her Flonase with significant improvement in symptoms.  Denies any sinus pain, ear pain or tooth pain.  ROS:   See pertinent positives and negatives per HPI.  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 (Patient not taking: Reported on 09/25/2020), Disp: 16 g, Rfl: 0   loratadine-pseudoephedrine (CLARITIN-D 24 HOUR) 10-240 MG 24 hr  tablet, Take 1 tablet by mouth daily. (Patient not taking: Reported on 09/29/2020), Disp: 30 tablet, Rfl: 0   montelukast (SINGULAIR) 10 MG tablet, TAKE 1 TABLET BY MOUTH EVERYDAY AT BEDTIME, Disp: 90 tablet, Rfl: 1   ondansetron (ZOFRAN) 4 MG tablet, Take 1 tablet (4 mg total) by mouth every 8 (eight) hours as needed for nausea or vomiting., Disp: 20 tablet, Rfl: 0   progesterone (PROMETRIUM) 100 MG capsule, Take 100 mg by mouth daily., Disp: , Rfl:    tiZANidine (ZANAFLEX) 2 MG tablet, TAKE 1 TABLET BY MOUTH EVERY 6-8 HOURS AS NEEDED FOR SPASM (Patient not taking: Reported on 09/25/2020), Disp: , Rfl: 0   triamcinolone cream (KENALOG) 0.1 %, Apply 1 application topically 2 (two) times daily. (Patient not taking: Reported on 09/29/2020), Disp: 30 g, Rfl: 0  Allergies  Allergen Reactions   Hydrocodone-Acetaminophen Itching    Objective:   There were no vitals taken for this visit.  Patient is well-developed, well-nourished in no acute distress.  Resting comfortably at home.  Head is normocephalic, atraumatic.  No labored breathing.  Speech is clear and coherent with logical content.  Patient is alert and oriented at baseline.   Assessment and Plan:   1. Acute viral sinusitis Symptoms improving over the past 48 hours, especially with addition of OTC medications.  No fever, chest pain or shortness of breath.  No sinus pain noted at present.  We will have her continue Flonase and Tylenol Cold and sinus.  Supportive measures and other OTC medications reviewed with patient.  Recommend, especially given her upcoming procedure, that she go ahead and be Covid tested. Quarantine until results are in.  She is to notify us of any new or worsening symptoms or if symptoms do not continue to improve/resolve as this may warrant repeat assessment and/or antibiotic.    Leeanne Rio, PA-C 11/03/2020

## 2020-11-05 ENCOUNTER — Other Ambulatory Visit: Payer: Self-pay

## 2020-11-05 ENCOUNTER — Telehealth: Payer: Self-pay | Admitting: Family Medicine

## 2020-11-05 ENCOUNTER — Encounter (HOSPITAL_BASED_OUTPATIENT_CLINIC_OR_DEPARTMENT_OTHER): Payer: Self-pay | Admitting: Surgery

## 2020-11-05 MED ORDER — AMOXICILLIN-POT CLAVULANATE 875-125 MG PO TABS: 1.0000 | ORAL_TABLET | Freq: Two times a day (BID) | ORAL | 0 refills | Status: AC

## 2020-11-05 NOTE — Telephone Encounter (Signed)
Patient called with PCP recommendations. Patient voiced understanding. Prescription sent to patient's preferred pharmacy.

## 2020-11-05 NOTE — Telephone Encounter (Signed)
Would have her continue care discussed at visit yesterday. Ok to send in Rx for Augmentin 875-125 mg BID x 7 days.

## 2020-11-05 NOTE — Telephone Encounter (Signed)
Patient called and states that she is feeling the same as she did when she had her virtual visit with [facility].  She is still extremely congested and is still using the flonase and tylenol sinus, please advise.

## 2020-11-05 NOTE — Telephone Encounter (Signed)
Patient was seen on 11/03/20. Covid testing is scheduled for 11/10/20. Please advise

## 2020-11-10 ENCOUNTER — Encounter (HOSPITAL_BASED_OUTPATIENT_CLINIC_OR_DEPARTMENT_OTHER)
Admission: RE | Admit: 2020-11-10 | Discharge: 2020-11-10 | Disposition: A | Discharge status: Home / Self Care | Payer: BC Managed Care – PPO | Source: Ambulatory Visit | Attending: Surgery | Admitting: Surgery

## 2020-11-10 ENCOUNTER — Encounter (HOSPITAL_COMMUNITY)
Admission: RE | Admit: 2020-11-10 | Discharge: 2020-11-10 | Disposition: A | Discharge status: Home / Self Care | Payer: BC Managed Care – PPO | Source: Ambulatory Visit

## 2020-11-10 ENCOUNTER — Other Ambulatory Visit: Payer: Self-pay

## 2020-11-10 DIAGNOSIS — Z20822 Contact with and (suspected) exposure to covid-19: Secondary | ICD-10-CM | POA: Insufficient documentation

## 2020-11-10 DIAGNOSIS — Z79890 Hormone replacement therapy (postmenopausal): Secondary | ICD-10-CM | POA: Diagnosis not present

## 2020-11-10 DIAGNOSIS — Z853 Personal history of malignant neoplasm of breast: Secondary | ICD-10-CM | POA: Diagnosis not present

## 2020-11-10 DIAGNOSIS — Z01812 Encounter for preprocedural laboratory examination: Secondary | ICD-10-CM | POA: Insufficient documentation

## 2020-11-10 DIAGNOSIS — N6021 Fibroadenosis of right breast: Secondary | ICD-10-CM | POA: Diagnosis not present

## 2020-11-10 DIAGNOSIS — D0511 Intraductal carcinoma in situ of right breast: Secondary | ICD-10-CM | POA: Diagnosis not present

## 2020-11-10 DIAGNOSIS — Z885 Allergy status to narcotic agent status: Secondary | ICD-10-CM | POA: Diagnosis not present

## 2020-11-10 DIAGNOSIS — Z79899 Other long term (current) drug therapy: Secondary | ICD-10-CM | POA: Diagnosis not present

## 2020-11-10 DIAGNOSIS — Z886 Allergy status to analgesic agent status: Secondary | ICD-10-CM | POA: Diagnosis not present

## 2020-11-10 LAB — COMPREHENSIVE METABOLIC PANEL
ALT: 51 U/L — ABNORMAL HIGH (ref 0–44)
AST: 35 U/L (ref 15–41)
Albumin: 3.9 g/dL (ref 3.5–5.0)
Alkaline Phosphatase: 92 U/L (ref 38–126)
Anion gap: 10 (ref 5–15)
BUN: 13 mg/dL (ref 6–20)
CO2: 26 mmol/L (ref 22–32)
Calcium: 9.4 mg/dL (ref 8.9–10.3)
Chloride: 105 mmol/L (ref 98–111)
Creatinine, Ser: 0.77 mg/dL (ref 0.44–1.00)
GFR, Estimated: >60 mL/min (ref >60)
Glucose, Bld: 86 mg/dL (ref 70–99)
Potassium: 4.6 mmol/L (ref 3.5–5.1)
Sodium: 141 mmol/L (ref 135–145)
Total Bilirubin: 0.7 mg/dL (ref 0.3–1.2)
Total Protein: 7.3 g/dL (ref 6.5–8.1)

## 2020-11-10 LAB — CBC WITH DIFFERENTIAL/PLATELET
Abs Immature Granulocytes: 0.02 10*3/uL (ref 0.00–0.07)
Basophils Absolute: 0 10*3/uL (ref 0.0–0.1)
Basophils Relative: 0 %
Eosinophils Absolute: 0.1 10*3/uL (ref 0.0–0.5)
Eosinophils Relative: 1 %
HCT: 41.4 % (ref 36.0–46.0)
Hemoglobin: 14.2 g/dL (ref 12.0–15.0)
Immature Granulocytes: 0 %
Lymphocytes Relative: 26 %
Lymphs Abs: 1.7 10*3/uL (ref 0.7–4.0)
MCH: 32.1 pg (ref 26.0–34.0)
MCHC: 34.3 g/dL (ref 30.0–36.0)
MCV: 93.5 fL (ref 80.0–100.0)
Monocytes Absolute: 0.6 10*3/uL (ref 0.1–1.0)
Monocytes Relative: 9 %
Neutro Abs: 4.3 10*3/uL (ref 1.7–7.7)
Neutrophils Relative %: 64 %
Platelets: 276 10*3/uL (ref 150–400)
RBC: 4.43 MIL/uL (ref 3.87–5.11)
RDW: 12.7 % (ref 11.5–15.5)
WBC: 6.8 10*3/uL (ref 4.0–10.5)
nRBC: 0 % (ref 0.0–0.2)

## 2020-11-10 NOTE — Progress Notes (Signed)
CHG soap and ERAS ensure presurgery drink. Pt verbalized understanding.        Enhanced Recovery after Surgery for Orthopedics Enhanced Recovery after Surgery is a protocol used to improve the stress on your body and your recovery after surgery.  Patient Instructions  . The night before surgery:  o No food after midnight. ONLY clear liquids after midnight  . The day of surgery (if you do NOT have diabetes):  o Drink ONE (1) Pre-Surgery Clear Ensure as directed.   o This drink was given to you during your hospital  pre-op appointment visit. o The pre-op nurse will instruct you on the time to drink the  Pre-Surgery Ensure depending on your surgery time. o Finish the drink at the designated time by the pre-op nurse.  o Nothing else to drink after completing the  Pre-Surgery Clear Ensure.  . The day of surgery (if you have diabetes): o Drink ONE (1) Gatorade 2 (G2) as directed. o This drink was given to you during your hospital  pre-op appointment visit.  o The pre-op nurse will instruct you on the time to drink the   Gatorade 2 (G2) depending on your surgery time. o Color of the Gatorade may vary. Red is not allowed. o Nothing else to drink after completing the  Gatorade 2 (G2).         If you have questions, please contact your surgeon's office.

## 2020-11-11 LAB — SARS CORONAVIRUS 2 (TAT 6-24 HRS): SARS Coronavirus 2: NEGATIVE

## 2020-11-12 ENCOUNTER — Encounter (HOSPITAL_BASED_OUTPATIENT_CLINIC_OR_DEPARTMENT_OTHER): Payer: Self-pay | Admitting: Surgery

## 2020-11-12 ENCOUNTER — Ambulatory Visit
Admission: RE | Admit: 2020-11-12 | Discharge: 2020-11-12 | Disposition: A | Discharge status: Home / Self Care | Payer: BC Managed Care – PPO | Source: Ambulatory Visit | Attending: Surgery | Admitting: Surgery

## 2020-11-12 ENCOUNTER — Other Ambulatory Visit: Payer: Self-pay

## 2020-11-12 DIAGNOSIS — D0511 Intraductal carcinoma in situ of right breast: Secondary | ICD-10-CM

## 2020-11-12 NOTE — Anesthesia Preprocedure Evaluation (Addendum)
Anesthesia Evaluation  Patient identified by MRN, date of birth, ID band Patient awake    Reviewed: Allergy & Precautions, NPO status , Patient's Chart, lab work & pertinent test results  Airway Mallampati: II  TM Distance: >3 FB Neck ROM: Full    Dental no notable dental hx. (+) Teeth Intact   Pulmonary Current Smoker and Patient abstained from smoking.,  Recent sinus infection with congestion   Pulmonary exam normal breath sounds clear to auscultation       Cardiovascular negative cardio ROS Normal cardiovascular exam Rhythm:Regular Rate:Normal     Neuro/Psych negative neurological ROS  negative psych ROS   GI/Hepatic negative GI ROS, Neg liver ROS,   Endo/Other  Ductal CIS right breast  Renal/GU negative Renal ROS  negative genitourinary   Musculoskeletal  (+) Arthritis , Cervical DDD   Abdominal   Peds  Hematology negative hematology ROS (+)   Anesthesia Other Findings   Reproductive/Obstetrics                            Anesthesia Physical Anesthesia Plan  ASA: II  Anesthesia Plan: General   Post-op Pain Management:    Induction: Intravenous  PONV Risk Score and Plan: 3 and Treatment may vary due to age or medical condition, Midazolam and Ondansetron  Airway Management Planned: LMA  Additional Equipment:   Intra-op Plan:   Post-operative Plan: Extubation in OR  Informed Consent: I have reviewed the patients History and Physical, chart, labs and discussed the procedure including the risks, benefits and alternatives for the proposed anesthesia with the patient or authorized representative who has indicated his/her understanding and acceptance.     Dental advisory given  Plan Discussed with: CRNA and Anesthesiologist  Anesthesia Plan Comments:        Anesthesia Quick Evaluation

## 2020-11-13 ENCOUNTER — Ambulatory Visit
Admission: RE | Admit: 2020-11-13 | Discharge: 2020-11-13 | Disposition: A | Discharge status: Home / Self Care | Payer: BC Managed Care – PPO | Source: Ambulatory Visit | Attending: Surgery | Admitting: Surgery

## 2020-11-13 ENCOUNTER — Ambulatory Visit (HOSPITAL_BASED_OUTPATIENT_CLINIC_OR_DEPARTMENT_OTHER): Payer: BC Managed Care – PPO | Admitting: Anesthesiology

## 2020-11-13 ENCOUNTER — Encounter (HOSPITAL_BASED_OUTPATIENT_CLINIC_OR_DEPARTMENT_OTHER): Payer: Self-pay | Admitting: Surgery

## 2020-11-13 ENCOUNTER — Encounter (HOSPITAL_BASED_OUTPATIENT_CLINIC_OR_DEPARTMENT_OTHER)
Admission: RE | Disposition: A | Discharge status: Home / Self Care | Payer: Self-pay | Source: Home / Self Care | Attending: Surgery

## 2020-11-13 ENCOUNTER — Other Ambulatory Visit: Payer: Self-pay

## 2020-11-13 ENCOUNTER — Ambulatory Visit (HOSPITAL_COMMUNITY)
Admission: RE | Admit: 2020-11-13 | Discharge: 2020-11-13 | Disposition: A | Discharge status: Home / Self Care | Payer: BC Managed Care – PPO | Attending: Surgery | Admitting: Surgery

## 2020-11-13 DIAGNOSIS — D0511 Intraductal carcinoma in situ of right breast: Secondary | ICD-10-CM

## 2020-11-13 DIAGNOSIS — Z853 Personal history of malignant neoplasm of breast: Secondary | ICD-10-CM | POA: Insufficient documentation

## 2020-11-13 DIAGNOSIS — Z886 Allergy status to analgesic agent status: Secondary | ICD-10-CM | POA: Insufficient documentation

## 2020-11-13 DIAGNOSIS — Z79890 Hormone replacement therapy (postmenopausal): Secondary | ICD-10-CM | POA: Insufficient documentation

## 2020-11-13 DIAGNOSIS — Z20822 Contact with and (suspected) exposure to covid-19: Secondary | ICD-10-CM | POA: Insufficient documentation

## 2020-11-13 DIAGNOSIS — Z79899 Other long term (current) drug therapy: Secondary | ICD-10-CM | POA: Insufficient documentation

## 2020-11-13 DIAGNOSIS — N6021 Fibroadenosis of right breast: Secondary | ICD-10-CM | POA: Insufficient documentation

## 2020-11-13 DIAGNOSIS — Z885 Allergy status to narcotic agent status: Secondary | ICD-10-CM | POA: Insufficient documentation

## 2020-11-13 HISTORY — DX: Allergy, unspecified, initial encounter: T78.40XA

## 2020-11-13 HISTORY — PX: BREAST LUMPECTOMY WITH RADIOACTIVE SEED LOCALIZATION: SHX6424

## 2020-11-13 SURGERY — BREAST LUMPECTOMY WITH RADIOACTIVE SEED LOCALIZATION
Anesthesia: General | Site: Breast | Laterality: Right

## 2020-11-13 MED ORDER — EPHEDRINE SULFATE 50 MG/ML IJ SOLN
INTRAMUSCULAR | Status: DC | PRN
  Administered 2020-11-13: 10 mg via INTRAVENOUS
  Administered 2020-11-13 (×2): 5 mg via INTRAVENOUS

## 2020-11-13 MED ORDER — FENTANYL CITRATE (PF) 100 MCG/2ML IJ SOLN
INTRAMUSCULAR | Status: AC
  Filled 2020-11-13: qty 2

## 2020-11-13 MED ORDER — EPHEDRINE 5 MG/ML INJ
INTRAVENOUS | Status: AC
  Filled 2020-11-13: qty 10

## 2020-11-13 MED ORDER — FENTANYL CITRATE (PF) 100 MCG/2ML IJ SOLN
INTRAMUSCULAR | Status: DC | PRN
  Administered 2020-11-13: 25 ug via INTRAVENOUS
  Administered 2020-11-13: 50 ug via INTRAVENOUS

## 2020-11-13 MED ORDER — MIDAZOLAM HCL 5 MG/5ML IJ SOLN
INTRAMUSCULAR | Status: DC | PRN
  Administered 2020-11-13 (×2): 1 mg via INTRAVENOUS

## 2020-11-13 MED ORDER — BUPIVACAINE-EPINEPHRINE (PF) 0.25% -1:200000 IJ SOLN
INTRAMUSCULAR | Status: DC | PRN
  Administered 2020-11-13: 20 mL

## 2020-11-13 MED ORDER — LACTATED RINGERS IV SOLN: INTRAVENOUS | Status: DC

## 2020-11-13 MED ORDER — IBUPROFEN 800 MG PO TABS: 800.0000 mg | ORAL_TABLET | Freq: Three times a day (TID) | ORAL | 0 refills | Status: AC | PRN

## 2020-11-13 MED ORDER — CELECOXIB 200 MG PO CAPS
ORAL_CAPSULE | ORAL | Status: AC
  Filled 2020-11-13: qty 1

## 2020-11-13 MED ORDER — OXYCODONE HCL 5 MG PO TABS: 5.0000 mg | ORAL_TABLET | Freq: Four times a day (QID) | ORAL | 0 refills | Status: AC | PRN

## 2020-11-13 MED ORDER — SODIUM CHLORIDE (PF) 0.9 % IJ SOLN
INTRAMUSCULAR | Status: AC
  Filled 2020-11-13: qty 10

## 2020-11-13 MED ORDER — CEFAZOLIN SODIUM-DEXTROSE 2-4 GM/100ML-% IV SOLN
2.0000 g | INTRAVENOUS | Status: AC
  Administered 2020-11-13: 2 g via INTRAVENOUS

## 2020-11-13 MED ORDER — CELECOXIB 200 MG PO CAPS
200.0000 mg | ORAL_CAPSULE | ORAL | Status: AC
  Administered 2020-11-13: 200 mg via ORAL

## 2020-11-13 MED ORDER — CHLORHEXIDINE GLUCONATE CLOTH 2 % EX PADS: 6.0000 | MEDICATED_PAD | Freq: Once | CUTANEOUS | Status: DC

## 2020-11-13 MED ORDER — ONDANSETRON HCL 4 MG/2ML IJ SOLN
INTRAMUSCULAR | Status: DC | PRN
  Administered 2020-11-13: 4 mg via INTRAVENOUS

## 2020-11-13 MED ORDER — DEXAMETHASONE SODIUM PHOSPHATE 10 MG/ML IJ SOLN
INTRAMUSCULAR | Status: AC
  Filled 2020-11-13: qty 1

## 2020-11-13 MED ORDER — OXYCODONE HCL 5 MG PO TABS
ORAL_TABLET | ORAL | Status: AC
  Filled 2020-11-13: qty 1

## 2020-11-13 MED ORDER — BUPIVACAINE-EPINEPHRINE (PF) 0.25% -1:200000 IJ SOLN
INTRAMUSCULAR | Status: AC
  Filled 2020-11-13: qty 30

## 2020-11-13 MED ORDER — METHYLENE BLUE 0.5 % INJ SOLN
INTRAVENOUS | Status: AC
  Filled 2020-11-13: qty 10

## 2020-11-13 MED ORDER — GABAPENTIN 300 MG PO CAPS
ORAL_CAPSULE | ORAL | Status: AC
  Filled 2020-11-13: qty 1

## 2020-11-13 MED ORDER — GABAPENTIN 300 MG PO CAPS
300.0000 mg | ORAL_CAPSULE | ORAL | Status: AC
  Administered 2020-11-13: 300 mg via ORAL

## 2020-11-13 MED ORDER — CEFAZOLIN SODIUM-DEXTROSE 2-4 GM/100ML-% IV SOLN
INTRAVENOUS | Status: AC
  Filled 2020-11-13: qty 100

## 2020-11-13 MED ORDER — MIDAZOLAM HCL 2 MG/2ML IJ SOLN
INTRAMUSCULAR | Status: AC
  Filled 2020-11-13: qty 2

## 2020-11-13 MED ORDER — LIDOCAINE HCL 1 % IJ SOLN
INTRAMUSCULAR | Status: DC | PRN
  Administered 2020-11-13: 60 mg via INTRADERMAL

## 2020-11-13 MED ORDER — ONDANSETRON HCL 4 MG/2ML IJ SOLN
4.0000 mg | Freq: Once | INTRAMUSCULAR | Status: AC | PRN
  Administered 2020-11-13: 4 mg via INTRAVENOUS

## 2020-11-13 MED ORDER — DEXAMETHASONE SODIUM PHOSPHATE 10 MG/ML IJ SOLN
INTRAMUSCULAR | Status: DC | PRN
  Administered 2020-11-13: 8 mg via INTRAVENOUS

## 2020-11-13 MED ORDER — PROPOFOL 10 MG/ML IV BOLUS
INTRAVENOUS | Status: AC
  Filled 2020-11-13: qty 20

## 2020-11-13 MED ORDER — LIDOCAINE 2% (20 MG/ML) 5 ML SYRINGE
INTRAMUSCULAR | Status: AC
  Filled 2020-11-13: qty 5

## 2020-11-13 MED ORDER — ONDANSETRON HCL 4 MG/2ML IJ SOLN
INTRAMUSCULAR | Status: AC
  Filled 2020-11-13: qty 2

## 2020-11-13 MED ORDER — 0.9 % SODIUM CHLORIDE (POUR BTL) OPTIME
TOPICAL | Status: DC | PRN
  Administered 2020-11-13: 200 mL

## 2020-11-13 MED ORDER — OXYCODONE HCL 5 MG PO TABS
5.0000 mg | ORAL_TABLET | Freq: Once | ORAL | Status: AC
Start: 2020-11-13 — End: 2020-11-13
  Administered 2020-11-13: 5 mg via ORAL

## 2020-11-13 MED ORDER — FENTANYL CITRATE (PF) 100 MCG/2ML IJ SOLN
25.0000 ug | INTRAMUSCULAR | Status: DC | PRN
  Administered 2020-11-13 (×2): 25 ug via INTRAVENOUS

## 2020-11-13 MED ORDER — PROPOFOL 10 MG/ML IV BOLUS
INTRAVENOUS | Status: DC | PRN
  Administered 2020-11-13: 150 mg via INTRAVENOUS

## 2020-11-13 SURGICAL SUPPLY — 56 items
ADH SKN CLS APL DERMABOND .7 (GAUZE/BANDAGES/DRESSINGS) ×1
APL PRP STRL LF DISP 70% ISPRP (MISCELLANEOUS) ×1
APPLIER CLIP 9.375 MED OPEN (MISCELLANEOUS) ×2
APR CLP MED 9.3 20 MLT OPN (MISCELLANEOUS) ×1
BINDER BREAST LRG (GAUZE/BANDAGES/DRESSINGS) ×1 IMPLANT
BINDER BREAST MEDIUM (GAUZE/BANDAGES/DRESSINGS) IMPLANT
BINDER BREAST XLRG (GAUZE/BANDAGES/DRESSINGS) IMPLANT
BINDER BREAST XXLRG (GAUZE/BANDAGES/DRESSINGS) IMPLANT
BLADE SURG 15 STRL LF DISP TIS (BLADE) ×1 IMPLANT
BLADE SURG 15 STRL SS (BLADE) ×2
CANISTER SUC SOCK COL 7IN (MISCELLANEOUS) IMPLANT
CANISTER SUCT 1200ML W/VALVE (MISCELLANEOUS) IMPLANT
CHLORAPREP W/TINT 26 (MISCELLANEOUS) ×2 IMPLANT
CLIP APPLIE 9.375 MED OPEN (MISCELLANEOUS) IMPLANT
COVER BACK TABLE 60X90IN (DRAPES) ×2 IMPLANT
COVER MAYO STAND STRL (DRAPES) ×2 IMPLANT
COVER PROBE W GEL 5X96 (DRAPES) ×2 IMPLANT
COVER WAND RF STERILE (DRAPES) IMPLANT
DECANTER SPIKE VIAL GLASS SM (MISCELLANEOUS) IMPLANT
DERMABOND ADVANCED (GAUZE/BANDAGES/DRESSINGS) ×1
DERMABOND ADVANCED .7 DNX12 (GAUZE/BANDAGES/DRESSINGS) ×1 IMPLANT
DRAPE LAPAROSCOPIC ABDOMINAL (DRAPES) IMPLANT
DRAPE LAPAROTOMY 100X72 PEDS (DRAPES) ×2 IMPLANT
DRAPE UTILITY XL STRL (DRAPES) ×2 IMPLANT
ELECT COATED BLADE 2.86 ST (ELECTRODE) ×2 IMPLANT
ELECT REM PT RETURN 9FT ADLT (ELECTROSURGICAL) ×2
ELECTRODE REM PT RTRN 9FT ADLT (ELECTROSURGICAL) ×1 IMPLANT
GLOVE BIOGEL PI IND STRL 7.0 (GLOVE) IMPLANT
GLOVE BIOGEL PI INDICATOR 7.0 (GLOVE) ×1
GLOVE ECLIPSE 6.5 STRL STRAW (GLOVE) ×1 IMPLANT
GLOVE ECLIPSE 8.0 STRL XLNG CF (GLOVE) ×2 IMPLANT
GLOVE SRG 8 PF TXTR STRL LF DI (GLOVE) ×1 IMPLANT
GLOVE SURG UNDER POLY LF SZ6.5 (GLOVE) ×1 IMPLANT
GLOVE SURG UNDER POLY LF SZ8 (GLOVE) ×2
GOWN STRL REUS W/ TWL LRG LVL3 (GOWN DISPOSABLE) ×2 IMPLANT
GOWN STRL REUS W/ TWL XL LVL3 (GOWN DISPOSABLE) ×1 IMPLANT
GOWN STRL REUS W/TWL LRG LVL3 (GOWN DISPOSABLE) ×4
GOWN STRL REUS W/TWL XL LVL3 (GOWN DISPOSABLE) ×2
HEMOSTAT ARISTA ABSORB 3G PWDR (HEMOSTASIS) IMPLANT
HEMOSTAT SNOW SURGICEL 2X4 (HEMOSTASIS) IMPLANT
KIT MARKER MARGIN INK (KITS) ×2 IMPLANT
NDL HYPO 25X1 1.5 SAFETY (NEEDLE) ×1 IMPLANT
NEEDLE HYPO 25X1 1.5 SAFETY (NEEDLE) ×2 IMPLANT
NS IRRIG 1000ML POUR BTL (IV SOLUTION) ×2 IMPLANT
PACK BASIN DAY SURGERY FS (CUSTOM PROCEDURE TRAY) ×2 IMPLANT
PENCIL SMOKE EVACUATOR (MISCELLANEOUS) ×2 IMPLANT
SLEEVE SCD COMPRESS KNEE MED (MISCELLANEOUS) ×2 IMPLANT
SPONGE LAP 4X18 RFD (DISPOSABLE) ×2 IMPLANT
SUT MNCRL AB 4-0 PS2 18 (SUTURE) ×2 IMPLANT
SUT SILK 2 0 SH (SUTURE) IMPLANT
SUT VICRYL 3-0 CR8 SH (SUTURE) ×2 IMPLANT
SYR CONTROL 10ML LL (SYRINGE) ×2 IMPLANT
TOWEL GREEN STERILE FF (TOWEL DISPOSABLE) ×2 IMPLANT
TRAY FAXITRON CT DISP (TRAY / TRAY PROCEDURE) ×2 IMPLANT
TUBE CONNECTING 20X1/4 (TUBING) ×1 IMPLANT
YANKAUER SUCT BULB TIP NO VENT (SUCTIONS) ×1 IMPLANT

## 2020-11-13 NOTE — Transfer of Care (Signed)
Immediate Anesthesia Transfer of Care Note  Patient: Lori [name]  Procedure(s) Performed: RIGHT BREAST LUMPECTOMY WITH RADIOACTIVE SEED LOCALIZATION (Right Breast)  Patient Location: PACU  Anesthesia Type:General  Level of Consciousness: oriented, drowsy and patient cooperative  Airway & Oxygen Therapy: Patient Spontanous Breathing and Patient connected to face mask oxygen  Post-op Assessment: Report given to RN and Post -op Vital signs reviewed and stable  Post vital signs: Reviewed and stable  Last Vitals:  Vitals Value Taken Time  BP 136/71 11/13/20 0830  Temp    Pulse 74 11/13/20 0830  Resp 17 11/13/20 0830  SpO2 100 % 11/13/20 0830  Vitals shown include unvalidated device data.  Last Pain:  Vitals:   11/13/20 0627  TempSrc: Oral  PainSc: 0-No pain         Complications: No complications documented.

## 2020-11-13 NOTE — Interval H&P Note (Signed)
History and Physical Interval Note:  11/13/2020 7:24 AM  Lori [name]  has presented today for surgery, with the diagnosis of DUCTAL CARCINOMA IN SITU RIGHT BREAST.  The various methods of treatment have been discussed with the patient and family. After consideration of risks, benefits and other options for treatment, the patient has consented to  Procedure(s): RIGHT BREAST LUMPECTOMY WITH RADIOACTIVE SEED LOCALIZATION (Right) as a surgical intervention.  The patient's history has been reviewed, patient examined, no change in status, stable for surgery.  I have reviewed the patient's chart and labs.  Questions were answered to the patient's satisfaction.     Dortha Schwalbe MD

## 2020-11-13 NOTE — Op Note (Signed)
Preoperative diagnosis: Right breast DCIS upper outer quadrant  Postoperative diagnosis: Same  Procedure: Right breast seed localized lumpectomy  Surgeon: Erroll Luna, MD  Anesthesia: LMA with 0.25% Marcaine with epinephrine  EBL: Minimal  Specimen: Right breast tissue with seed and clip verified by Faxitron with additional right lateral margin  Drains: None  IV fluids: Per anesthesia record  Indications: Patient is a 54 year old female diagnosed with DCIS.  This was diagnosed by mammography and core biopsy.  She was evaluated by the multidisciplinary clinic physicians and opted for breast conserving surgery after the pros and cons of lumpectomy were weighed against the pros and cons of mastectomy and reconstruction.  Adjuvant therapies were discussed with the patient.  The pros and cons lumpectomy and complications of lumpectomy were discussed.  Cosmetic outcome discussed.  Possible reexcision discussed.The procedure has been discussed with the patient. Alternatives to surgery have been discussed with the patient.  Risks of surgery include bleeding,  Infection,  Seroma formation, death,  and the need for further surgery.   The patient understands and wishes to proceed.  Description of procedure: The patient was met in the holding area and questions were answered.  The neoprobe was used to verify seed location in the right breast upper outer quadrant.  Films were available for review.  All questions were answered.  She was then taken back to the operating room.  She is placed supine upon the OR table.  After induction of general esthesia, the right breast was prepped and draped in sterile fashion timeout performed.  Proper patient, site and procedure were verified.  Neoprobe was used to localize the seed in the right breast upper outer quadrant.  Curvilinear incision was made along the lateral border of the nipple areolar complex.  Dissection was carried down all tissue around the seed and  clip were excised with a grossly negative margin.  The lateral margin appeared close therefore took an additional lateral margin sent separately.  All tissue was oriented with ink.  The Faxitron image revealed the seed and clip to be in the specimen.  Hemostasis achieved.  Irrigation was used.  Clips were placed to mark the cavity for postop radiation therapy.  Cavity closed with 3-0 Vicryl and 4 Monocryl.  Dermabond applied.  All counts were found to be correct.  Breast binder placed.  The patient was awoke extubated taken recovery in satisfactory condition.

## 2020-11-13 NOTE — Anesthesia Postprocedure Evaluation (Signed)
Anesthesia Post Note  Patient: Lori [name]  Procedure(s) Performed: RIGHT BREAST LUMPECTOMY WITH RADIOACTIVE SEED LOCALIZATION (Right Breast)     Patient location during evaluation: PACU Anesthesia Type: General Level of consciousness: awake and alert and oriented Pain management: pain level controlled Vital Signs Assessment: post-procedure vital signs reviewed and stable Respiratory status: spontaneous breathing, nonlabored ventilation and respiratory function stable Cardiovascular status: blood pressure returned to baseline and stable Postop Assessment: no apparent nausea or vomiting Anesthetic complications: no   No complications documented.  Last Vitals:  Vitals:   11/13/20 0845 11/13/20 0855  BP: 136/74 131/76  Pulse: (!) 55 64  Resp: 13 14  Temp:    SpO2: 95% 96%    Last Pain:  Vitals:   11/13/20 0845  TempSrc:   PainSc: 4                  Tashema Tiller A.

## 2020-11-13 NOTE — Discharge Instructions (Signed)
Next dose of NSAID (Ibuprofen/Motrin/Aleve) can be given at 2:30pm if needed.   Post Anesthesia Home Care Instructions  Activity: Get plenty of rest for the remainder of the day. A responsible individual must stay with you for 24 hours following the procedure.  For the next 24 hours, DO NOT: -Drive a car -Advertising copywriter -Drink alcoholic beverages -Take any medication unless instructed by your physician -Make any legal decisions or sign important papers.  Meals: Start with liquid foods such as gelatin or soup. Progress to regular foods as tolerated. Avoid greasy, spicy, heavy foods. If nausea and/or vomiting occur, drink only clear liquids until the nausea and/or vomiting subsides. Call your physician if vomiting continues.  Special Instructions/Symptoms: Your throat may feel dry or sore from the anesthesia or the breathing tube placed in your throat during surgery. If this causes discomfort, gargle with warm salt water. The discomfort should disappear within 24 hours.  If you had a scopolamine patch placed behind your ear for the management of post- operative nausea and/or vomiting:  1. The medication in the patch is effective for 72 hours, after which it should be removed.  Wrap patch in a tissue and discard in the trash. Wash hands thoroughly with soap and water. 2. You may remove the patch earlier than 72 hours if you experience unpleasant side effects which may include dry mouth, dizziness or visual disturbances. 3. Avoid touching the patch. Wash your hands with soap and water after contact with the patch.   [facility] Office Phone Number [phone]  BREAST BIOPSY/ PARTIAL MASTECTOMY: POST OP INSTRUCTIONS  Always review your discharge instruction sheet given to you by the facility where your surgery was performed.  IF YOU HAVE DISABILITY OR FAMILY LEAVE FORMS, YOU MUST BRING THEM TO THE OFFICE FOR PROCESSING.  DO NOT GIVE THEM TO YOUR DOCTOR.  1. A  prescription for pain medication may be given to you upon discharge.  Take your pain medication as prescribed, if needed.  If narcotic pain medicine is not needed, then you may take acetaminophen (Tylenol) or ibuprofen (Advil) as needed. 2. Take your usually prescribed medications unless otherwise directed 3. If you need a refill on your pain medication, please contact your pharmacy.  They will contact our office to request authorization.  Prescriptions will not be filled after 5pm or on week-ends. 4. You should eat very light the first 24 hours after surgery, such as soup, crackers, pudding, etc.  Resume your normal diet the day after surgery. 5. Most patients will experience some swelling and bruising in the breast.  Ice packs and a good support bra will help.  Swelling and bruising can take several days to resolve.  6. It is common to experience some constipation if taking pain medication after surgery.  Increasing fluid intake and taking a stool softener will usually help or prevent this problem from occurring.  A mild laxative (Milk of Magnesia or Miralax) should be taken according to package directions if there are no bowel movements after 48 hours. 7. Unless discharge instructions indicate otherwise, you may remove your bandages 24-48 hours after surgery, and you may shower at that time.  You may have steri-strips (small skin tapes) in place directly over the incision.  These strips should be left on the skin for 7-10 days.  If your surgeon used skin glue on the incision, you may shower in 24 hours.  The glue will flake off over the next 2-3 weeks.  Any sutures or staples  will be removed at the office during your follow-up visit. 8. ACTIVITIES:  You may resume regular daily activities (gradually increasing) beginning the next day.  Wearing a good support bra or sports bra minimizes pain and swelling.  You may have sexual intercourse when it is comfortable. a. You may drive when you no longer are  taking prescription pain medication, you can comfortably wear a seatbelt, and you can safely maneuver your car and apply brakes. b. RETURN TO WORK:  ______________________________________________________________________________________ 9. You should see your doctor in the office for a follow-up appointment approximately two weeks after your surgery.  Your doctor's nurse will typically make your follow-up appointment when she calls you with your pathology report.  Expect your pathology report 2-3 business days after your surgery.  You may call to check if you do not hear from [place] after three days. 10. OTHER INSTRUCTIONS: _______________________________________________________________________________________________ _____________________________________________________________________________________________________________________________________ _____________________________________________________________________________________________________________________________________ _____________________________________________________________________________________________________________________________________  WHEN TO CALL YOUR DOCTOR: 1. Fever over 101.0 2. Nausea and/or vomiting. 3. Extreme swelling or bruising. 4. Continued bleeding from incision. 5. Increased pain, redness, or drainage from the incision.  The clinic staff is available to answer your questions during regular business hours.  Please don't hesitate to call and ask to speak to one of the nurses for clinical concerns.  If you have a medical emergency, go to the nearest emergency room or call 911.  A surgeon from [facility] is always on call at the hospital.  For further questions, please visit [email or web address]

## 2020-11-13 NOTE — Anesthesia Procedure Notes (Signed)
Procedure Name: LMA Insertion Date/Time: 11/13/2020 7:34 AM Performed by: Garth Bigness, CRNA Pre-anesthesia Checklist: Patient identified, Suction available and Patient being monitored Patient Re-evaluated:Patient Re-evaluated prior to induction Oxygen Delivery Method: Circle system utilized Preoxygenation: Pre-oxygenation with 100% oxygen Induction Type: IV induction Ventilation: Mask ventilation without difficulty LMA: LMA inserted LMA Size: 3.0 Number of attempts: 1 Placement Confirmation: positive ETCO2 Tube secured with: Tape Dental Injury: Teeth and Oropharynx as per pre-operative assessment

## 2020-11-13 NOTE — H&P (Signed)
Lori [name]  Location: [facility] Patient #: [id number] DOB: 1966-10-12 Married / Language: English / Race: White Female  History of Present Illness  Patient words: Patient presents at the request of Lori [name] due to abnormal breast mammogram. 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 Patient states she felt something was all but denied breast pain, breast mass or nipple discharge. Strong history of breast cancer noted with 4 for treatment relatives diagnosed in her 51s. Final pathology showed high-grade DCIS as stated above. Density of breast B.      CLINICAL DATA: The patient was called back for right breast calcifications.  EXAM: DIGITAL DIAGNOSTIC RIGHT MAMMOGRAM  COMPARISON: Previous exam(s).  ACR Breast Density Category b: There are scattered areas of fibroglandular density.  FINDINGS: The new calcifications in the lateral central right breast span 5.5 mm and are pleomorphic.  IMPRESSION: Indeterminate new right breast calcifications spanning 5.5 mm.  RECOMMENDATION: Recommend stereotactic biopsy of the new right breast calcifications.  I have discussed the findings and recommendations with the patient. If applicable, a reminder letter will be sent to the patient regarding the next appointment.  BI-RADS CATEGORY 4: Suspicious.   Electronically Signed By: Lori [name] M.D On: 08/19/2020 16:01         Patient: Lori [name] Collected: 08/28/2020 Client: [facility] Accession: [id number] Received: 08/28/2020 Lori Kuster, [name] DOB: 04-10-1966 Age: 54 Gender: F Reported: 08/29/2020 [address] [place] [id number] Patient Ph: [phone] MRN #: [id number] [place], [place] [address] Client Acc#: Chart #: [id number]  Phone: Fax: CC: Lori [name] Lori [name] REPORT OF SURGICAL PATHOLOGY ADDITIONAL INFORMATION: PROGNOSTIC INDICATORS Results: IMMUNOHISTOCHEMICAL AND MORPHOMETRIC ANALYSIS PERFORMED MANUALLY Estrogen Receptor: 0%, NEGATIVE Progesterone Receptor: 0%, NEGATIVE COMMENT: The negative hormone receptor study(ies) in this case has an internal positive control. 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 1 of 2 FINAL for Lori [name] [phone]) Microscopic Comment The DCIS appears to partially involve a fibroadenoma. Ancillary studies will be reported separately. Results reported to [facility] on 08/29/2020. Dr. Vic [name] reviewed. Lori Manners [name] Pathologist, Electronic Signature (Case signed 08/29/2020.  The patient is a 54 year old female.   Past Surgical History Lori [name], CMA; 09/15/2020 9:25 AM) Breast Biopsy Right. Oral Surgery Spinal Surgery - Lower Back Spinal Surgery - Neck  Diagnostic Studies History Lori [name], CMA; 09/15/2020 9:25 AM) Colonoscopy never Mammogram within last year Pap Smear 1-5 years ago  Allergies  HYDROcodone-Acetaminophen *ANALGESICS - OPIOID*  Medication History  Estradiol (1MG  Tablet, Oral) Active. Progesterone Micronized (100MG  Capsule, Oral) Active. DULoxetine HCl (30MG  Capsule DR Part, Oral) Active. Loratadine (10MG  Capsule, Oral) Active. Medications Reconciled  Pregnancy / Birth History  Age at menarche 70 years. Age of menopause <43 Contraceptive History Oral contraceptives. Gravida 5 Irregular periods Length  (months) of breastfeeding 7-12 Maternal age 69-25 Para 3  Other Problems  Anxiety Disorder Back Pain Breast Cancer Lump In Breast     Review of Systems  General Not Present- Appetite Loss, Chills, Fatigue, Fever, Night Sweats, Weight Gain and  Weight Loss. Skin Not Present- Change in Wart/Mole, Dryness, Hives, Jaundice, New Lesions, Non-Healing Wounds, Rash and Ulcer. HEENT Present- Seasonal Allergies and Wears glasses/contact lenses. Not Present- Earache, Hearing Loss, Hoarseness, Nose Bleed, Oral Ulcers, Ringing in the Ears, Sinus Pain, Sore Throat, Visual Disturbances and Yellow Eyes. Breast Present- Breast Mass and Breast Pain. Not Present- Nipple Discharge and Skin Changes. Cardiovascular Not Present- Chest Pain, Difficulty Breathing Lying Down, Leg Cramps, Palpitations, Rapid Heart Rate, Shortness of Breath and Swelling of Extremities. Gastrointestinal Not Present- Abdominal Pain, Bloating, Bloody Stool, Change in Bowel Habits, Chronic diarrhea, Constipation, Difficulty Swallowing, Excessive gas, Gets full quickly at meals, Hemorrhoids, Indigestion, Nausea, Rectal Pain and Vomiting. Female Genitourinary Not Present- Frequency, Nocturia, Painful Urination, Pelvic Pain and Urgency. Musculoskeletal Present- Back Pain. Not Present- Joint Pain, Joint Stiffness, Muscle Pain, Muscle Weakness and Swelling of Extremities. Neurological Not Present- Decreased Memory, Fainting, Headaches, Numbness, Seizures, Tingling, Tremor, Trouble walking and Weakness. Psychiatric Not Present- Anxiety, Bipolar, Change in Sleep Pattern, Depression, Fearful and Frequent crying. Endocrine Not Present- Cold Intolerance, Excessive Hunger, Hair Changes, Heat Intolerance, Hot flashes and New Diabetes. Hematology Not Present- Blood Thinners, Easy Bruising, Excessive bleeding, Gland problems, HIV and Persistent Infections.  Vitals  09/15/2020 9:27 AM Weight: 146.6 lb Height: 63in Body Surface Area:  1.69 m Body Mass Index: 25.97 kg/m  Temp.: 98.30F(Tympanic)  Pulse: 74 (Regular)  BP: 130/82(Sitting, Left Arm, Standard)        Physical Exam   General Mental Status-Alert. General Appearance-Consistent with stated age. Hydration-Well hydrated. Voice-Normal.  Head and Neck Head-normocephalic, atraumatic with no lesions or palpable masses. Trachea-midline. Thyroid Gland Characteristics - normal size and consistency.  Chest and Lung Exam Chest and lung exam reveals -quiet, even and easy respiratory effort with no use of accessory muscles and on auscultation, normal breath sounds, no adventitious sounds and normal vocal resonance. Inspection Chest Wall - Normal. Back - normal.  Breast Breast - Left-Symmetric, Non Tender, No Biopsy scars, no Dimpling - Left, No Inflammation, No Lumpectomy scars, No Mastectomy scars, No Peau d' Orange. Breast - Right-Symmetric, Non Tender, No Biopsy scars, no Dimpling - Right, No Inflammation, No Lumpectomy scars, No Mastectomy scars, No Peau d' Orange. Breast Lump-No Palpable Breast Mass.  Cardiovascular Cardiovascular examination reveals -normal heart sounds, regular rate and rhythm with no murmurs and normal pedal pulses bilaterally.  Neurologic Neurologic evaluation reveals -alert and oriented x 3 with no impairment of recent or remote memory. Mental Status-Normal.  Lymphatic Head & Neck  General Head & Neck Lymphatics: Bilateral - Description - Normal. Axillary  General Axillary Region: Bilateral - Description - Normal. Tenderness - Non Tender.    Assessment & Plan   BREAST NEOPLASM, TIS (DCIS), RIGHT (D05.11) Impression: Discussed surgical management as well as medical management of this disease. She is opted for right breast seed localized lumpectomy. Discussed mastectomy and reconstruction. Discussed genetic counseling given family history. Refer to medical and  radiation oncology as well as genetics. Plan for right breast seed localized lumpectomy. Discussed long-term prognosis and long-term expectations as well as the possible need for radiation therapy and the impact that has on cosmesis. Total time 45 minutes   Risk of lumpectomy include bleeding, infection, seroma, more surgery, use of seed/wire, wound care, cosmetic deformity and the need for other treatments, death , blood clots, death. Pt agrees to proceed.  Current Plans You are being scheduled for surgery- Our schedulers will call you.  You should hear from our office's scheduling department within 5 working  days about the location, date, and time of surgery. We try to make accommodations for patient's preferences in scheduling surgery, but sometimes the OR schedule or the surgeon's schedule prevents [place] from making those accommodations.  If you have not heard from our office [phone]) in 5 working days, call the office and ask for your surgeon's nurse.  If you have other questions about your diagnosis, plan, or surgery, call the office and ask for your surgeon's nurse.  Pt Education - Pamphlet Given - Breast Biopsy: discussed with patient and provided information.

## 2020-11-17 ENCOUNTER — Encounter (HOSPITAL_BASED_OUTPATIENT_CLINIC_OR_DEPARTMENT_OTHER): Payer: Self-pay | Admitting: Surgery

## 2020-11-17 LAB — SURGICAL PATHOLOGY

## 2020-11-20 ENCOUNTER — Encounter: Payer: Self-pay | Admitting: *Deleted

## 2020-12-08 ENCOUNTER — Encounter: Payer: Self-pay | Admitting: Radiation Oncology

## 2020-12-08 ENCOUNTER — Telehealth: Payer: Self-pay

## 2020-12-08 ENCOUNTER — Other Ambulatory Visit: Payer: Self-pay | Admitting: Family Medicine

## 2020-12-08 ENCOUNTER — Other Ambulatory Visit: Payer: Self-pay

## 2020-12-08 DIAGNOSIS — R058 Other specified cough: Secondary | ICD-10-CM

## 2020-12-08 NOTE — Telephone Encounter (Signed)
Spoke with patient in regards to telephone visit with Shona Simpson PA on 12/09/20 @ 10:30. Patient verbalized understanding of appointment date and time. Reviewed meaningful use questions.TM

## 2020-12-09 ENCOUNTER — Ambulatory Visit
Admission: RE | Admit: 2020-12-09 | Discharge: 2020-12-09 | Disposition: A | Discharge status: Home / Self Care | Payer: BC Managed Care – PPO | Source: Ambulatory Visit | Attending: Radiation Oncology | Admitting: Radiation Oncology

## 2020-12-09 DIAGNOSIS — D0511 Intraductal carcinoma in situ of right breast: Secondary | ICD-10-CM

## 2020-12-09 NOTE — Progress Notes (Signed)
Radiation Oncology         [phone] ________________________________  Initial Outpatient Consultation - Conducted via telephone due to current COVID-19 concerns for limiting patient exposure  I spoke with the patient to conduct this consult visit via telephone to spare the patient unnecessary potential exposure in the healthcare setting during the current COVID-19 pandemic. The patient was notified in advance and was offered a WebEX meeting to allow for face to face communication but unfortunately reported that they did not have the appropriate resources/technology to support such a visit and instead preferred to proceed with a telephone consult.    Name: Lori [name]        MRN: [id number]  Date of Service: 12/09/2020 DOB: 08-12-1966  VP:XTGGYI, Helane Rima, MD  Magrinat, Valentino Hue, MD     REFERRING PHYSICIAN: Magrinat, Valentino Hue, MD   DIAGNOSIS: The encounter diagnosis was Ductal carcinoma in situ (DCIS) of right breast.   HISTORY OF PRESENT ILLNESS: Lori [name] is a 55 y.o. female originally seen in the multidisciplinary breast clinic for a new diagnosis of left breast cancer. The patient was noted to have a screening detected mass in the right breast measuring approximately 5 mm, and biopsy confirmed this high-grade DCIS that were ER/PR negative.  She has since undergone lumpectomy on 11/13/2020 which revealed high-grade DCIS with focal necrosis partially involving a fibroadenomatous lesion measuring 2.6 cm but the overall DCIS measured 4 mm.  Additional lateral margin was negative, but the closest margin was 2 mm anteriorly.  She is contacted today to discuss treatment recommendations for her cancer.    PREVIOUS RADIATION THERAPY: No   PAST MEDICAL HISTORY:  Past Medical History:  Diagnosis Date  . Allergies   . Breast cancer (HCC) 08/28/2020  . DDD (degenerative disc disease), cervical   . Early menopause        PAST SURGICAL HISTORY: Past Surgical History:   Procedure Laterality Date  . ABLATION  3 years ago   Endometrial   . BREAST LUMPECTOMY WITH RADIOACTIVE SEED LOCALIZATION Right 11/13/2020   Procedure: RIGHT BREAST LUMPECTOMY WITH RADIOACTIVE SEED LOCALIZATION;  Surgeon: Harriette Bouillon, MD;  Location: [facility];  Service: General;  Laterality: Right;  . CERVICAL DISC SURGERY     x 2  . LUMBAR DISC SURGERY    . TUBAL LIGATION       FAMILY HISTORY:  Family History  Problem Relation Age of Onset  . Skin cancer Mother        unknown age of onset  . Alcohol abuse Father   . Breast cancer Maternal Grandmother        dx before 50  . Alcohol abuse Sister   . Skin cancer Brother        dx 31s; face  . Lung cancer Maternal Aunt        dx 20s  . Lung cancer Maternal Uncle        dx 22s  . Breast cancer Paternal Aunt        dx unknown age  . Lung cancer Paternal Uncle        x3  . Breast cancer Other        distant maternal cousin; dx 59s  . Breast cancer Cousin        maternal cousin, bilateral?, dx 49s     SOCIAL HISTORY:  reports that she has been smoking cigarettes. She has never used smokeless tobacco. She reports current alcohol use. She reports  that she does not use drugs.  The patient is married and lives in [place].  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    ALLERGIES: Hydrocodone-acetaminophen   MEDICATIONS:  Current Outpatient Medications  Medication Sig Dispense Refill  . DULoxetine (CYMBALTA) 30 MG capsule Take 30 mg by mouth daily.    Marland Kitchen estradiol (ESTRACE) 1 MG tablet Take 1 mg by mouth daily.  11  . fluticasone (FLONASE) 50 MCG/ACT nasal spray Place 2 sprays into both nostrils daily. 16 g 0  . ibuprofen (ADVIL) 800 MG tablet Take 1 tablet (800 mg total) by mouth every 8 (eight) hours as needed. 30 tablet 0  . ondansetron (ZOFRAN) 4 MG tablet Take 1 tablet (4 mg total) by mouth every 8 (eight) hours as needed for nausea or vomiting. 20 tablet 0  .  oxyCODONE (OXY IR/ROXICODONE) 5 MG immediate release tablet Take 1 tablet (5 mg total) by mouth every 6 (six) hours as needed for severe pain. 15 tablet 0  . progesterone (PROMETRIUM) 100 MG capsule Take 100 mg by mouth daily.    . montelukast (SINGULAIR) 10 MG tablet TAKE 1 TABLET BY MOUTH EVERYDAY AT BEDTIME 90 tablet 1   No current facility-administered medications for this encounter.     REVIEW OF SYSTEMS: On review of systems, the patient reports that she is doing well overall.  She reports that she has done very well since her surgery, she has had minimal soreness, and is overall ready to move forward with additional treatment as recommended.  No other complaints are verbalized.     PHYSICAL EXAM:  Wt Readings from Last 3 Encounters:  11/13/20 145 lb 8.1 oz (66 kg)  09/29/20 145 lb 14.4 oz (66.2 kg)  09/25/20 146 lb 6.4 oz (66.4 kg)   Unable to assess given encounter type.    ECOG = 0  0 - Asymptomatic (Fully active, able to carry on all predisease activities without restriction)  1 - Symptomatic but completely ambulatory (Restricted in physically strenuous activity but ambulatory and able to carry out work of a light or sedentary nature. For example, light housework, office work)  2 - Symptomatic, <50% in bed during the day (Ambulatory and capable of all self care but unable to carry out any work activities. Up and about more than 50% of waking hours)  3 - Symptomatic, >50% in bed, but not bedbound (Capable of only limited self-care, confined to bed or chair 50% or more of waking hours)  4 - Bedbound (Completely disabled. Cannot carry on any self-care. Totally confined to bed or chair)  5 - Death   Eustace Pen MM, Creech RH, Tormey DC, et al. [phone]). "Toxicity and response criteria of the [facility]". [place] Oncol. 5 (6): 649-55    LABORATORY DATA:  Lab Results  Component Value Date   WBC 6.8 11/10/2020   HGB 14.2 11/10/2020   HCT 41.4  11/10/2020   MCV 93.5 11/10/2020   PLT 276 11/10/2020   Lab Results  Component Value Date   NA 141 11/10/2020   K 4.6 11/10/2020   CL 105 11/10/2020   CO2 26 11/10/2020   Lab Results  Component Value Date   ALT 51 (H) 11/10/2020   AST 35 11/10/2020   ALKPHOS 92 11/10/2020   BILITOT 0.7 11/10/2020      RADIOGRAPHY: MM Breast Surgical Specimen  Result Date: 11/13/2020 CLINICAL DATA:  Status post lumpectomy today after earlier radioactive seed  localization. EXAM: SPECIMEN RADIOGRAPH OF THE RIGHT BREAST COMPARISON:  Previous exam(s). FINDINGS: Status post excision of the right breast. The radioactive seed and biopsy marker clip are present and completely intact. Findings discussed with the OR staff during the procedure. IMPRESSION: Specimen radiograph of the right breast. Electronically Signed   By: Franki Cabot M.D.   On: 11/13/2020 08:06   MM RT RADIOACTIVE SEED LOC MAMMO GUIDE  Result Date: 11/12/2020 CLINICAL DATA:  Patient for preoperative localization prior to right breast lumpectomy. EXAM: MAMMOGRAPHIC GUIDED RADIOACTIVE SEED LOCALIZATION OF THE RIGHT BREAST COMPARISON:  Previous exam(s). FINDINGS: Patient presents for radioactive seed localization prior to right breast lumpectomy. I met with the patient and we discussed the procedure of seed localization including benefits and alternatives. We discussed the high likelihood of a successful procedure. We discussed the risks of the procedure including infection, bleeding, tissue injury and further surgery. We discussed the low dose of radioactivity involved in the procedure. Informed, written consent was given. The usual time-out protocol was performed immediately prior to the procedure. Using mammographic guidance, sterile technique, 1% lidocaine and an I-125 radioactive seed, biopsy marking clip and residual calcifications right breast were localized using a lateral approach. The follow-up mammogram images confirm the seed in the  expected location and were marked for Dr. Brantley Stage. Follow-up survey of the patient confirms presence of the radioactive seed. Order number of I-125 seed:  [id number]. Total activity:  6.599 millicuries reference Date: 09/30/2020 The patient tolerated the procedure well and was released from the [facility]. She was given instructions regarding seed removal. IMPRESSION: Radioactive seed localization right breast. No apparent complications. Electronically Signed   By: Lovey Newcomer M.D.   On: 11/12/2020 15:01       IMPRESSION/PLAN: 1. High-grade ER/PR negative DCIS of the right breast. Dr. Lisbeth Renshaw has reviewed her pathology results, and would recommend adjuvant radiotherapy now that she is well-healed from her surgery.  We spent time today discussing the rationale for external beam radiation to reduce risks of local recurrence. We discussed the risks, benefits, short, and long term effects of radiotherapy, as well as the curative intent, and the patient is interested in proceeding.  I reviewed the delivery and logistics of radiotherapy and that Dr. Lisbeth Renshaw recommends 4 weeks of radiotherapy.  She will come in for simulation tomorrow at 8 AM at which time she will signed written consent to proceed for treatment with curative intent.  Given current concerns for patient exposure during the COVID-19 pandemic, this encounter was conducted via telephone.  The patient has provided two factor identification and has given verbal consent for this type of encounter and has been advised to only accept a meeting of this type in a secure network environment. The time spent during this encounter was 45 minutes including preparation, discussion, and coordination of the patient's care. The attendants for this meeting include Blenda Nicely, RN, Hayden Pedro  and Aunna H Notz.  During the encounter,  Blenda Nicely, RN and Hayden Pedro were located at [facility].  Kylar  H Hamme was located at home.      Carola Rhine, PAC

## 2020-12-10 ENCOUNTER — Ambulatory Visit
Admission: RE | Admit: 2020-12-10 | Discharge: 2020-12-10 | Disposition: A | Discharge status: Home / Self Care | Payer: BC Managed Care – PPO | Source: Ambulatory Visit | Attending: Radiation Oncology | Admitting: Radiation Oncology

## 2020-12-10 ENCOUNTER — Other Ambulatory Visit: Payer: Self-pay

## 2020-12-10 ENCOUNTER — Telehealth: Payer: Self-pay

## 2020-12-10 DIAGNOSIS — Z510 Encounter for antineoplastic radiation therapy: Secondary | ICD-10-CM | POA: Insufficient documentation

## 2020-12-10 DIAGNOSIS — D0512 Intraductal carcinoma in situ of left breast: Secondary | ICD-10-CM | POA: Insufficient documentation

## 2020-12-10 DIAGNOSIS — Z170 Estrogen receptor positive status [ER+]: Secondary | ICD-10-CM | POA: Insufficient documentation

## 2020-12-10 NOTE — Telephone Encounter (Signed)
Patient called in stating Lori [name] prescribed her Zofran for nausea but did not pick it up as she had a few pills left but now is completely out and is asking for a refill.

## 2020-12-11 ENCOUNTER — Other Ambulatory Visit: Payer: Self-pay

## 2020-12-11 DIAGNOSIS — R112 Nausea with vomiting, unspecified: Secondary | ICD-10-CM

## 2020-12-11 MED ORDER — ONDANSETRON HCL 4 MG PO TABS: 4.0000 mg | ORAL_TABLET | Freq: Three times a day (TID) | ORAL | 0 refills | Status: AC | PRN

## 2020-12-11 NOTE — Telephone Encounter (Signed)
Rx sent to pharmacy   

## 2020-12-14 DIAGNOSIS — D0512 Intraductal carcinoma in situ of left breast: Secondary | ICD-10-CM | POA: Diagnosis not present

## 2020-12-16 ENCOUNTER — Encounter: Payer: Self-pay | Admitting: *Deleted

## 2020-12-16 ENCOUNTER — Ambulatory Visit: Payer: BC Managed Care – PPO | Admitting: Radiation Oncology

## 2020-12-17 ENCOUNTER — Other Ambulatory Visit: Payer: Self-pay

## 2020-12-17 ENCOUNTER — Ambulatory Visit
Admission: RE | Admit: 2020-12-17 | Discharge: 2020-12-17 | Disposition: A | Discharge status: Home / Self Care | Payer: BC Managed Care – PPO | Source: Ambulatory Visit | Attending: Radiation Oncology | Admitting: Radiation Oncology

## 2020-12-17 DIAGNOSIS — D0511 Intraductal carcinoma in situ of right breast: Secondary | ICD-10-CM | POA: Diagnosis present

## 2020-12-17 DIAGNOSIS — Z510 Encounter for antineoplastic radiation therapy: Secondary | ICD-10-CM | POA: Insufficient documentation

## 2020-12-18 ENCOUNTER — Other Ambulatory Visit: Payer: Self-pay

## 2020-12-18 ENCOUNTER — Ambulatory Visit
Admission: RE | Admit: 2020-12-18 | Discharge: 2020-12-18 | Disposition: A | Discharge status: Home / Self Care | Payer: BC Managed Care – PPO | Source: Ambulatory Visit | Attending: Radiation Oncology | Admitting: Radiation Oncology

## 2020-12-18 DIAGNOSIS — D0511 Intraductal carcinoma in situ of right breast: Secondary | ICD-10-CM | POA: Diagnosis not present

## 2020-12-18 NOTE — Progress Notes (Signed)

## 2020-12-19 ENCOUNTER — Ambulatory Visit
Admission: RE | Admit: 2020-12-19 | Discharge: 2020-12-19 | Disposition: A | Discharge status: Home / Self Care | Payer: BC Managed Care – PPO | Source: Ambulatory Visit | Attending: Radiation Oncology | Admitting: Radiation Oncology

## 2020-12-19 ENCOUNTER — Other Ambulatory Visit: Payer: Self-pay

## 2020-12-19 DIAGNOSIS — D0511 Intraductal carcinoma in situ of right breast: Secondary | ICD-10-CM | POA: Diagnosis not present

## 2020-12-19 MED ORDER — RADIAPLEXRX EX GEL: Freq: Once | CUTANEOUS | Status: AC

## 2020-12-19 MED ORDER — ALRA NON-METALLIC DEODORANT (RAD-ONC)
1.0000 "application " | Freq: Once | TOPICAL | Status: AC
  Administered 2020-12-19: 1 via TOPICAL

## 2020-12-22 ENCOUNTER — Ambulatory Visit: Payer: BC Managed Care – PPO

## 2020-12-23 ENCOUNTER — Ambulatory Visit
Admission: RE | Admit: 2020-12-23 | Discharge: 2020-12-23 | Disposition: A | Discharge status: Home / Self Care | Payer: BC Managed Care – PPO | Source: Ambulatory Visit | Attending: Radiation Oncology | Admitting: Radiation Oncology

## 2020-12-23 ENCOUNTER — Other Ambulatory Visit: Payer: Self-pay

## 2020-12-23 DIAGNOSIS — D0511 Intraductal carcinoma in situ of right breast: Secondary | ICD-10-CM | POA: Diagnosis not present

## 2020-12-24 ENCOUNTER — Ambulatory Visit
Admission: RE | Admit: 2020-12-24 | Discharge: 2020-12-24 | Disposition: A | Discharge status: Home / Self Care | Payer: BC Managed Care – PPO | Source: Ambulatory Visit | Attending: Radiation Oncology | Admitting: Radiation Oncology

## 2020-12-24 DIAGNOSIS — D0511 Intraductal carcinoma in situ of right breast: Secondary | ICD-10-CM | POA: Diagnosis not present

## 2020-12-25 ENCOUNTER — Other Ambulatory Visit: Payer: Self-pay

## 2020-12-25 ENCOUNTER — Ambulatory Visit
Admission: RE | Admit: 2020-12-25 | Discharge: 2020-12-25 | Disposition: A | Discharge status: Home / Self Care | Payer: BC Managed Care – PPO | Source: Ambulatory Visit | Attending: Radiation Oncology | Admitting: Radiation Oncology

## 2020-12-25 DIAGNOSIS — D0511 Intraductal carcinoma in situ of right breast: Secondary | ICD-10-CM | POA: Diagnosis not present

## 2020-12-26 ENCOUNTER — Encounter: Payer: Self-pay | Admitting: Radiation Oncology

## 2020-12-26 ENCOUNTER — Other Ambulatory Visit: Payer: Self-pay

## 2020-12-26 ENCOUNTER — Ambulatory Visit
Admission: RE | Admit: 2020-12-26 | Discharge: 2020-12-26 | Disposition: A | Discharge status: Home / Self Care | Payer: BC Managed Care – PPO | Source: Ambulatory Visit | Attending: Radiation Oncology | Admitting: Radiation Oncology

## 2020-12-26 DIAGNOSIS — D0511 Intraductal carcinoma in situ of right breast: Secondary | ICD-10-CM | POA: Diagnosis not present

## 2020-12-29 ENCOUNTER — Ambulatory Visit
Admission: RE | Admit: 2020-12-29 | Discharge: 2020-12-29 | Disposition: A | Discharge status: Home / Self Care | Payer: BC Managed Care – PPO | Source: Ambulatory Visit | Attending: Radiation Oncology | Admitting: Radiation Oncology

## 2020-12-29 ENCOUNTER — Other Ambulatory Visit: Payer: Self-pay

## 2020-12-29 DIAGNOSIS — D0511 Intraductal carcinoma in situ of right breast: Secondary | ICD-10-CM | POA: Diagnosis not present

## 2020-12-30 ENCOUNTER — Other Ambulatory Visit: Payer: Self-pay

## 2020-12-30 ENCOUNTER — Ambulatory Visit
Admission: RE | Admit: 2020-12-30 | Discharge: 2020-12-30 | Disposition: A | Discharge status: Home / Self Care | Payer: BC Managed Care – PPO | Source: Ambulatory Visit | Attending: Radiation Oncology | Admitting: Radiation Oncology

## 2020-12-30 DIAGNOSIS — D0511 Intraductal carcinoma in situ of right breast: Secondary | ICD-10-CM | POA: Diagnosis not present

## 2020-12-31 ENCOUNTER — Ambulatory Visit: Payer: BC Managed Care – PPO

## 2020-12-31 ENCOUNTER — Telehealth: Payer: Self-pay | Admitting: *Deleted

## 2020-12-31 NOTE — Telephone Encounter (Signed)
Spoke with the patient who states she is having quite a bit of chest congestion, productive cough, and feels exhausted.  After discussion with the PA it was recommended that she have covid testing done.  Patient informed and verbalized understanding.  She was asked to provide [place] with her results.  Will continue to follow as necessary.  Gloriajean Dell. Leonie Green, BSN

## 2021-01-01 ENCOUNTER — Ambulatory Visit
Admission: RE | Admit: 2021-01-01 | Discharge: 2021-01-01 | Disposition: A | Discharge status: Home / Self Care | Payer: BC Managed Care – PPO | Source: Ambulatory Visit | Attending: Radiation Oncology | Admitting: Radiation Oncology

## 2021-01-01 DIAGNOSIS — D0511 Intraductal carcinoma in situ of right breast: Secondary | ICD-10-CM | POA: Diagnosis not present

## 2021-01-01 NOTE — Progress Notes (Signed)
  Radiation Oncology         [phone] ________________________________  Name: Lori [name] MRN: [id number]  Date: 12/26/2020  DOB: 1966-08-05  SIMULATION NOTE   NARRATIVE:  The patient underwent simulation today for ongoing radiation therapy.  The existing CT study set was employed for the purpose of virtual treatment planning.  The target and avoidance structures were reviewed and modified as necessary.  Treatment planning then occurred.  The radiation boost prescription was entered and confirmed.  A total of 1 complex treatment devices were fabricated in the form of multi-leaf collimators to shape radiation around the targets while maximally excluding nearby normal structures. I have requested : Isodose Plan/ electron plan.    PLAN:  This modified radiation beam arrangement is intended to continue the current radiation dose to an additional 8 Gy in 2 fractions for a total cumulative dose of 50.56 Gy.    ------------------------------------------------  Jodelle Gross, MD, PhD

## 2021-01-02 ENCOUNTER — Ambulatory Visit: Payer: BC Managed Care – PPO | Admitting: Radiation Oncology

## 2021-01-02 ENCOUNTER — Ambulatory Visit
Admission: RE | Admit: 2021-01-02 | Discharge: 2021-01-02 | Disposition: A | Discharge status: Home / Self Care | Payer: BC Managed Care – PPO | Source: Ambulatory Visit | Attending: Radiation Oncology | Admitting: Radiation Oncology

## 2021-01-02 DIAGNOSIS — D0511 Intraductal carcinoma in situ of right breast: Secondary | ICD-10-CM | POA: Diagnosis not present

## 2021-01-05 ENCOUNTER — Ambulatory Visit
Admission: RE | Admit: 2021-01-05 | Discharge: 2021-01-05 | Disposition: A | Discharge status: Home / Self Care | Payer: BC Managed Care – PPO | Source: Ambulatory Visit | Attending: Radiation Oncology | Admitting: Radiation Oncology

## 2021-01-05 DIAGNOSIS — D0511 Intraductal carcinoma in situ of right breast: Secondary | ICD-10-CM | POA: Diagnosis not present

## 2021-01-06 ENCOUNTER — Ambulatory Visit
Admission: RE | Admit: 2021-01-06 | Discharge: 2021-01-06 | Disposition: A | Discharge status: Home / Self Care | Payer: BC Managed Care – PPO | Source: Ambulatory Visit | Attending: Radiation Oncology | Admitting: Radiation Oncology

## 2021-01-06 DIAGNOSIS — D0511 Intraductal carcinoma in situ of right breast: Secondary | ICD-10-CM | POA: Diagnosis not present

## 2021-01-07 ENCOUNTER — Ambulatory Visit
Admission: RE | Admit: 2021-01-07 | Discharge: 2021-01-07 | Disposition: A | Discharge status: Home / Self Care | Payer: BC Managed Care – PPO | Source: Ambulatory Visit | Attending: Radiation Oncology | Admitting: Radiation Oncology

## 2021-01-07 ENCOUNTER — Ambulatory Visit: Payer: BC Managed Care – PPO

## 2021-01-07 DIAGNOSIS — D0511 Intraductal carcinoma in situ of right breast: Secondary | ICD-10-CM | POA: Diagnosis not present

## 2021-01-08 ENCOUNTER — Ambulatory Visit
Admission: RE | Admit: 2021-01-08 | Discharge: 2021-01-08 | Disposition: A | Discharge status: Home / Self Care | Payer: BC Managed Care – PPO | Source: Ambulatory Visit | Attending: Radiation Oncology | Admitting: Radiation Oncology

## 2021-01-08 ENCOUNTER — Ambulatory Visit: Payer: BC Managed Care – PPO

## 2021-01-08 DIAGNOSIS — D0511 Intraductal carcinoma in situ of right breast: Secondary | ICD-10-CM | POA: Diagnosis not present

## 2021-01-09 ENCOUNTER — Ambulatory Visit
Admission: RE | Admit: 2021-01-09 | Discharge: 2021-01-09 | Disposition: A | Discharge status: Home / Self Care | Payer: BC Managed Care – PPO | Source: Ambulatory Visit | Attending: Radiation Oncology | Admitting: Radiation Oncology

## 2021-01-09 ENCOUNTER — Ambulatory Visit: Payer: BC Managed Care – PPO | Admitting: Radiation Oncology

## 2021-01-09 DIAGNOSIS — D0511 Intraductal carcinoma in situ of right breast: Secondary | ICD-10-CM | POA: Diagnosis not present

## 2021-01-12 ENCOUNTER — Encounter: Payer: Self-pay | Admitting: *Deleted

## 2021-01-12 ENCOUNTER — Ambulatory Visit: Payer: BC Managed Care – PPO

## 2021-01-12 ENCOUNTER — Ambulatory Visit
Admission: RE | Admit: 2021-01-12 | Discharge: 2021-01-12 | Disposition: A | Discharge status: Home / Self Care | Payer: BC Managed Care – PPO | Source: Ambulatory Visit | Attending: Radiation Oncology | Admitting: Radiation Oncology

## 2021-01-12 DIAGNOSIS — D0511 Intraductal carcinoma in situ of right breast: Secondary | ICD-10-CM | POA: Diagnosis not present

## 2021-01-13 ENCOUNTER — Ambulatory Visit
Admission: RE | Admit: 2021-01-13 | Discharge: 2021-01-13 | Disposition: A | Discharge status: Home / Self Care | Payer: BC Managed Care – PPO | Source: Ambulatory Visit | Attending: Radiation Oncology | Admitting: Radiation Oncology

## 2021-01-13 DIAGNOSIS — Z510 Encounter for antineoplastic radiation therapy: Secondary | ICD-10-CM | POA: Diagnosis not present

## 2021-01-13 DIAGNOSIS — D0511 Intraductal carcinoma in situ of right breast: Secondary | ICD-10-CM | POA: Insufficient documentation

## 2021-01-14 ENCOUNTER — Ambulatory Visit
Admission: RE | Admit: 2021-01-14 | Discharge: 2021-01-14 | Disposition: A | Discharge status: Home / Self Care | Payer: BC Managed Care – PPO | Source: Ambulatory Visit | Attending: Radiation Oncology | Admitting: Radiation Oncology

## 2021-01-14 DIAGNOSIS — D0511 Intraductal carcinoma in situ of right breast: Secondary | ICD-10-CM | POA: Diagnosis not present

## 2021-01-15 ENCOUNTER — Ambulatory Visit
Admission: RE | Admit: 2021-01-15 | Discharge: 2021-01-15 | Disposition: A | Discharge status: Home / Self Care | Payer: BC Managed Care – PPO | Source: Ambulatory Visit | Attending: Radiation Oncology | Admitting: Radiation Oncology

## 2021-01-15 ENCOUNTER — Encounter: Payer: Self-pay | Admitting: Radiation Oncology

## 2021-01-15 DIAGNOSIS — D0511 Intraductal carcinoma in situ of right breast: Secondary | ICD-10-CM | POA: Diagnosis not present

## 2021-01-22 NOTE — Progress Notes (Signed)
  Patient Name: Lori [name] MRN: [id number] DOB: 1966-04-18 Referring Physician: Annye Asa (Profile Not Attached) Date of Service: 01/15/2021 [facility], [place]                                                        End Of Treatment Note  Diagnoses: D05.11-Intraductal carcinoma in situ of right breast  Cancer Staging: High-grade ER/PR negative DCIS of the right breast  Intent: Curative  Radiation Treatment Dates: 12/17/2020 through 01/15/2021 Site Technique Total Dose (Gy) Dose per Fx (Gy) Completed Fx Beam Energies  Breast, Right: Breast_Rt 3D 42.56/42.56 2.66 16/16 6X  Breast, Right: Breast_Rt_Bst specialPort 8/8 2 4/4 15E   Narrative: The patient tolerated radiation therapy relatively well. She developed anticipated skin changes in the treatment field.  Plan: The patient will receive a call in about one month from the radiation oncology department. She will continue follow up with Dr. Jana Hakim as well.   ________________________________________________    Carola Rhine, [facility]

## 2021-02-16 ENCOUNTER — Other Ambulatory Visit: Payer: Self-pay

## 2021-02-16 ENCOUNTER — Ambulatory Visit
Admission: RE | Admit: 2021-02-16 | Discharge: 2021-02-16 | Disposition: A | Discharge status: Home / Self Care | Payer: BC Managed Care – PPO | Source: Ambulatory Visit | Attending: Radiation Oncology | Admitting: Radiation Oncology

## 2021-02-16 DIAGNOSIS — D0511 Intraductal carcinoma in situ of right breast: Secondary | ICD-10-CM

## 2021-02-16 NOTE — Progress Notes (Signed)
One month follow-up post treatment call. Patient states that her skin is doing well after radiation treatment. Advised patient that skin is sensitive for 1 year after radiation treatment and has the potential to sunburn easily. Advised to use sunscreen with an SPF of 30 or higher. Patient verbalized understanding of information given.

## 2021-05-13 ENCOUNTER — Encounter: Payer: Self-pay | Admitting: *Deleted

## 2021-08-28 ENCOUNTER — Other Ambulatory Visit: Payer: Self-pay | Admitting: Obstetrics and Gynecology

## 2021-08-28 DIAGNOSIS — Z853 Personal history of malignant neoplasm of breast: Secondary | ICD-10-CM

## 2021-10-01 ENCOUNTER — Ambulatory Visit
Admission: RE | Admit: 2021-10-01 | Discharge: 2021-10-01 | Disposition: A | Discharge status: Home / Self Care | Payer: BC Managed Care – PPO | Source: Ambulatory Visit | Attending: Obstetrics and Gynecology | Admitting: Obstetrics and Gynecology

## 2021-10-01 DIAGNOSIS — Z853 Personal history of malignant neoplasm of breast: Secondary | ICD-10-CM

## 2021-12-29 ENCOUNTER — Encounter (HOSPITAL_COMMUNITY): Payer: Self-pay

## 2022-09-13 ENCOUNTER — Encounter: Payer: Self-pay | Admitting: Family Medicine

## 2022-09-13 ENCOUNTER — Other Ambulatory Visit: Payer: Self-pay | Admitting: Obstetrics and Gynecology

## 2022-09-13 DIAGNOSIS — Z853 Personal history of malignant neoplasm of breast: Secondary | ICD-10-CM

## 2022-11-03 ENCOUNTER — Other Ambulatory Visit: Payer: Self-pay | Admitting: Obstetrics and Gynecology

## 2022-11-03 ENCOUNTER — Ambulatory Visit
Admission: RE | Admit: 2022-11-03 | Discharge: 2022-11-03 | Disposition: A | Discharge status: Home / Self Care | Payer: BC Managed Care – PPO | Source: Ambulatory Visit | Attending: Obstetrics and Gynecology | Admitting: Obstetrics and Gynecology

## 2022-11-03 DIAGNOSIS — Z853 Personal history of malignant neoplasm of breast: Secondary | ICD-10-CM

## 2023-04-21 IMAGING — MG DIGITAL DIAGNOSTIC BILAT W/ TOMO W/ CAD
9 series · 9 of 25 positions shown · non-contrast
Comparison: Previous exam(s).

CLINICAL DATA: Patient underwent right lumpectomy for breast
carcinoma, performed 11/13/2020. Routine annual post lumpectomy
surveillance.

EXAM:
DIGITAL DIAGNOSTIC BILATERAL MAMMOGRAM WITH TOMOSYNTHESIS AND CAD
TECHNIQUE: Bilateral digital diagnostic mammography and breast tomosynthesis
was performed. The images were evaluated with computer-aided
detection.

[R MLO]
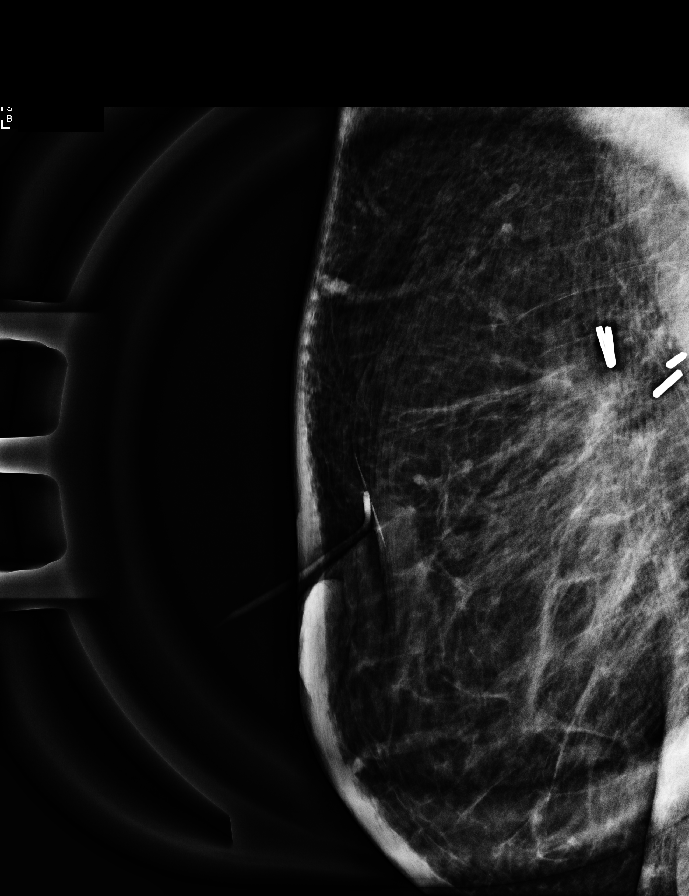

[L MLO synth-2D]
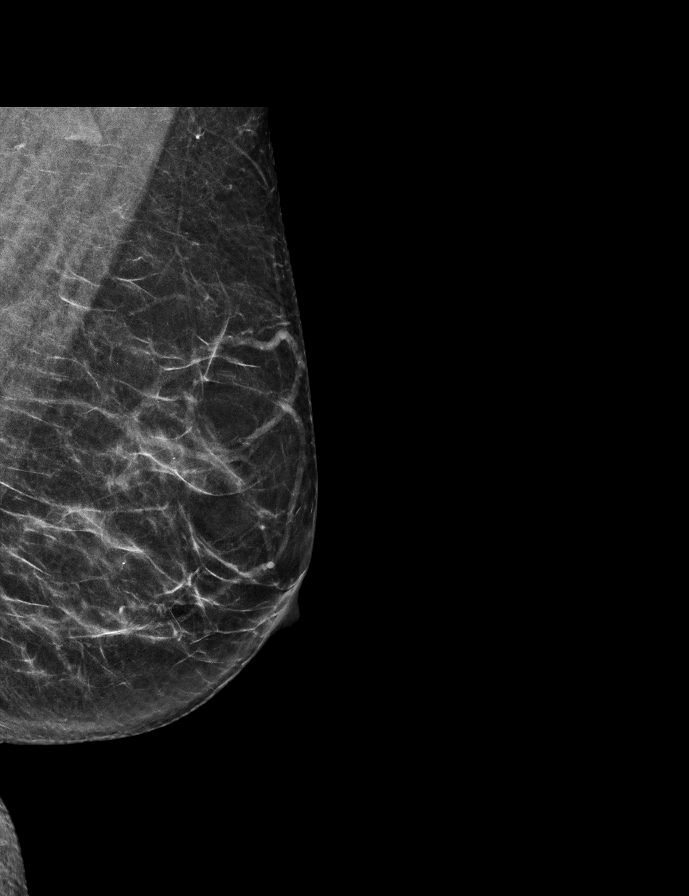

[R CC synth-2D]
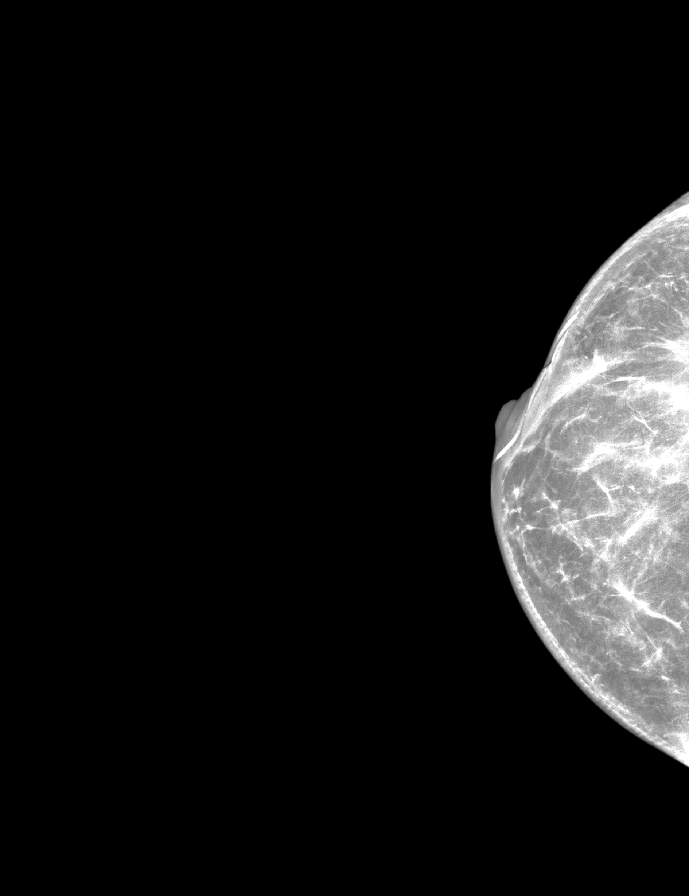

[R MLO synth-2D]
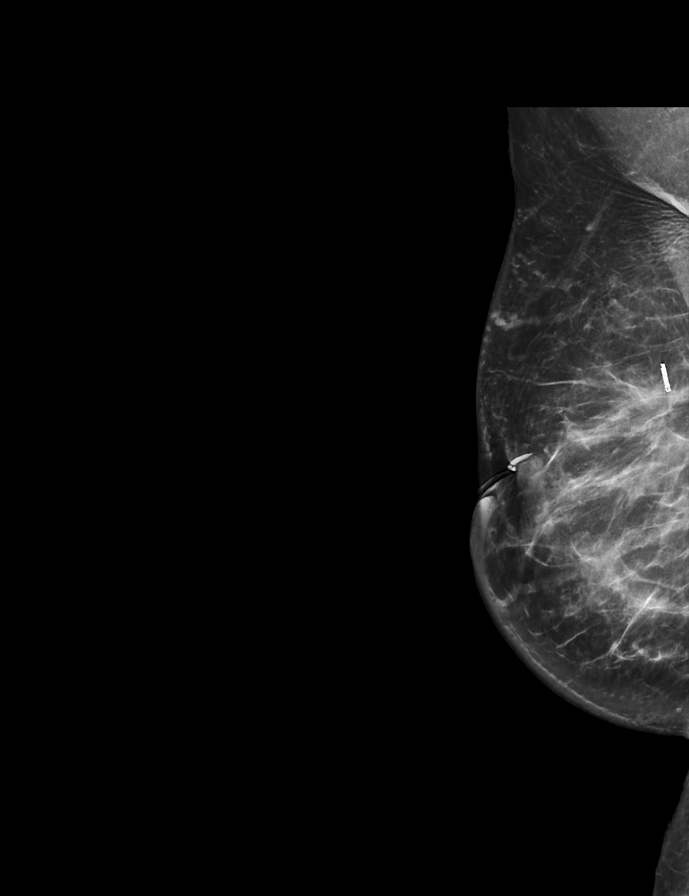

[L CC synth-2D]
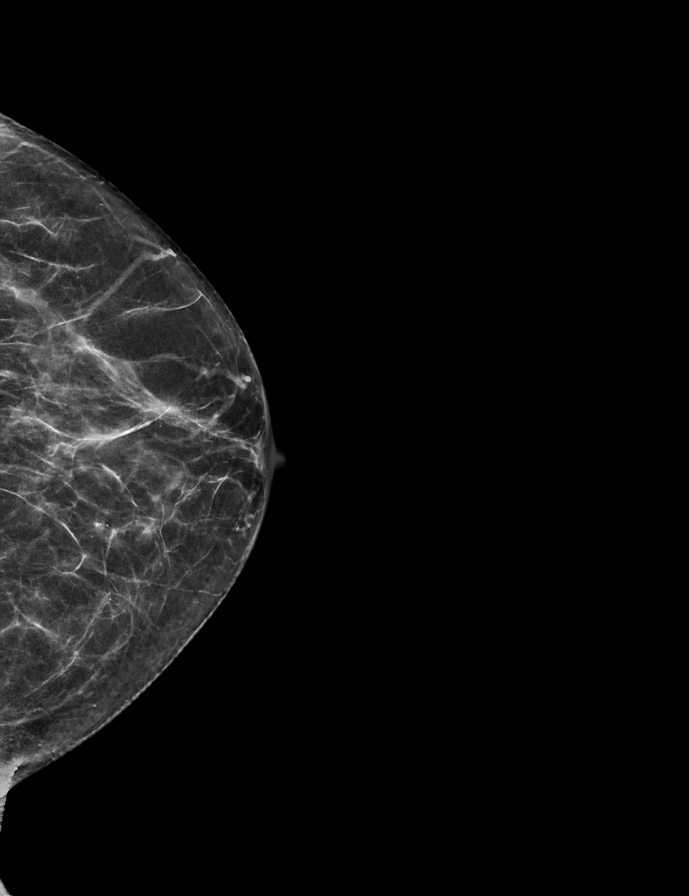

[R MLO tomo · tomo slice 38/75.0]
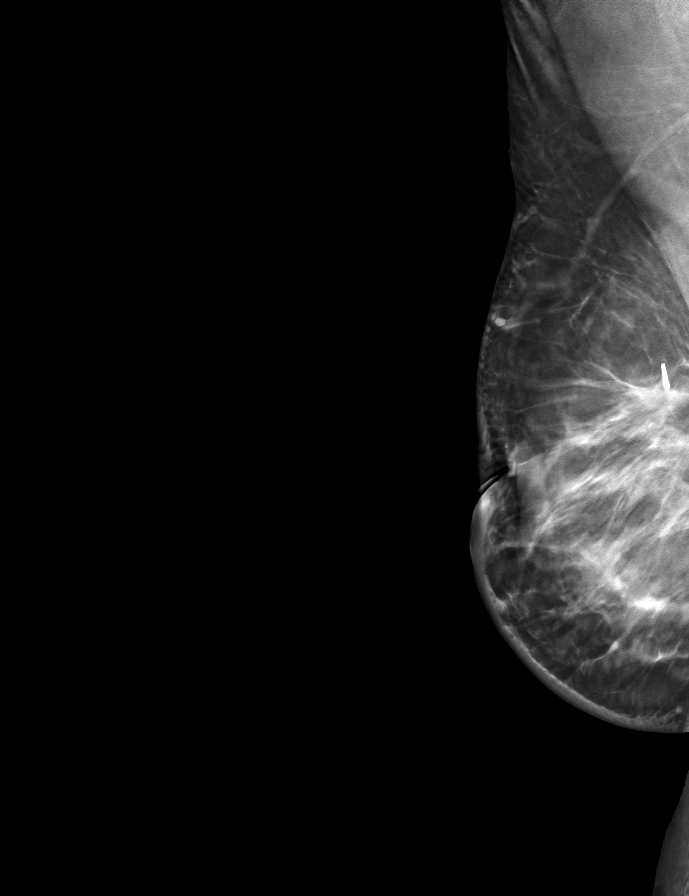

[R CC tomo · tomo slice 31/60.0]
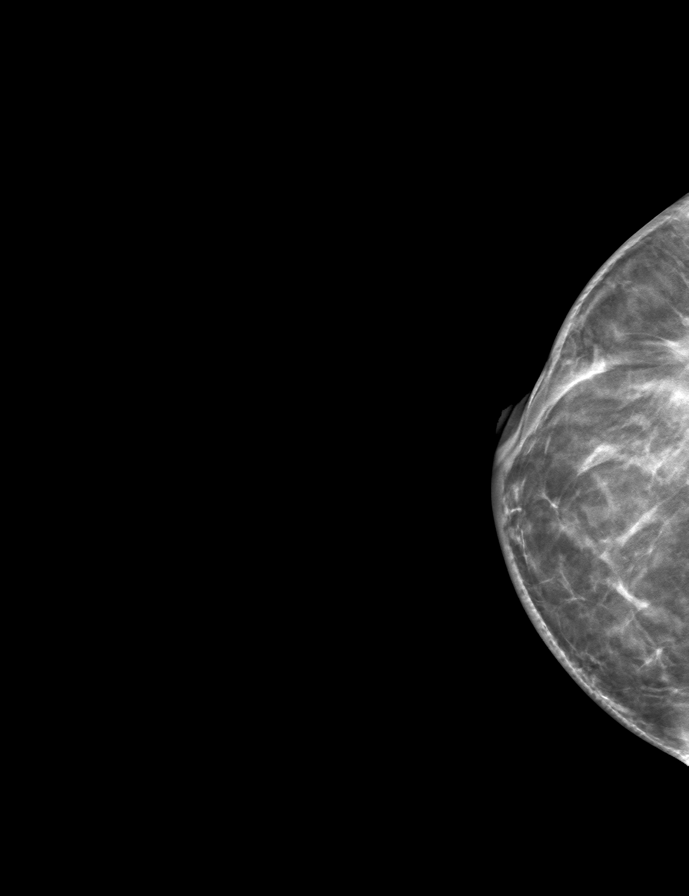

[L MLO tomo · tomo slice 29/58.0]
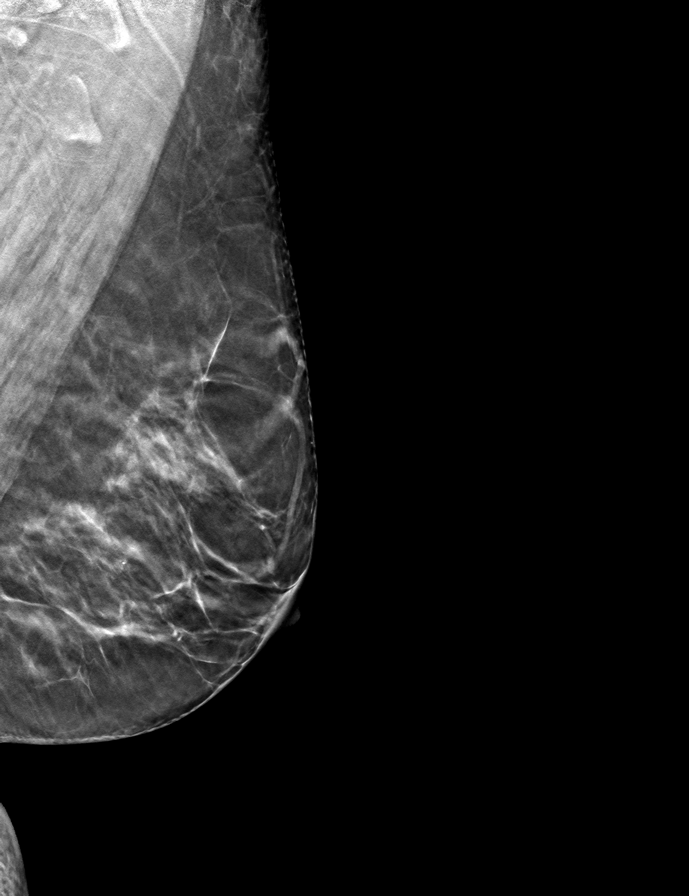

[L CC tomo · tomo slice 26/51.0]
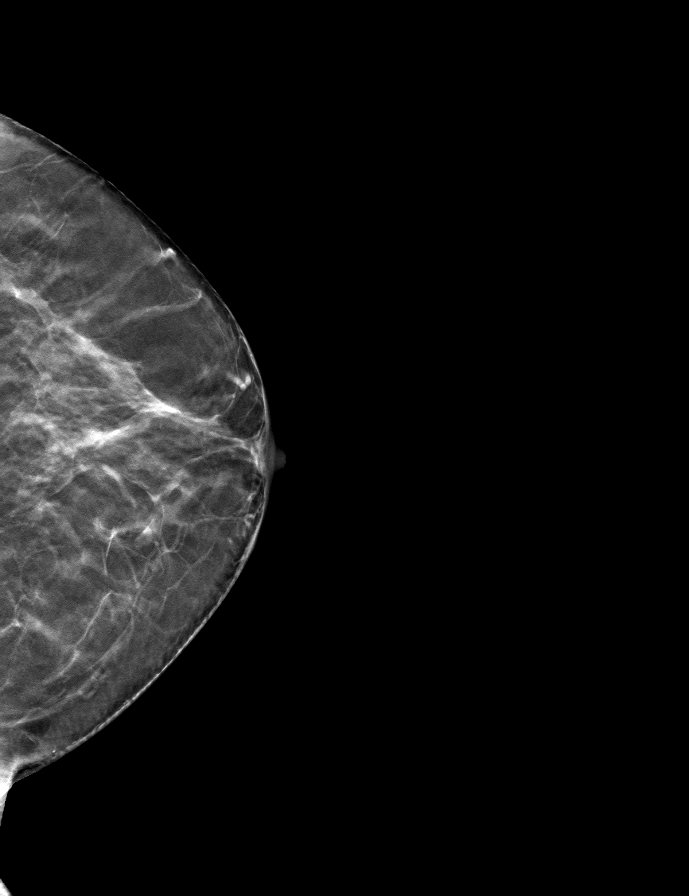

[9 of 25 positions shown; findings below may reference images not displayed]

ACR Breast Density Category b: There are scattered areas of
fibroglandular density.
FINDINGS: There are post lumpectomy changes the posterior, upper outer right
breast.

There are no breast masses, areas of nonsurgical architectural
distortion or suspicious calcifications.
IMPRESSION: 1. No evidence of new or recurrent breast carcinoma.
2. Benign post lumpectomy changes on the right.

RECOMMENDATION:
Diagnostic mammography in 1 year per standard post lumpectomy
protocol.

I have discussed the findings and recommendations with the patient.
If applicable, a reminder letter will be sent to the patient
regarding the next appointment.

BI-RADS CATEGORY  2: Benign.

## 2023-05-10 ENCOUNTER — Ambulatory Visit
Admission: RE | Admit: 2023-05-10 | Discharge: 2023-05-10 | Disposition: A | Discharge status: Home / Self Care | Payer: BC Managed Care – PPO | Source: Ambulatory Visit | Attending: Obstetrics and Gynecology | Admitting: Obstetrics and Gynecology

## 2023-05-10 DIAGNOSIS — Z853 Personal history of malignant neoplasm of breast: Secondary | ICD-10-CM

## 2023-05-30 ENCOUNTER — Other Ambulatory Visit: Payer: Self-pay | Admitting: Obstetrics and Gynecology

## 2023-05-30 DIAGNOSIS — R921 Mammographic calcification found on diagnostic imaging of breast: Secondary | ICD-10-CM

## 2023-09-19 ENCOUNTER — Encounter: Payer: Self-pay | Admitting: Obstetrics and Gynecology

## 2023-11-11 ENCOUNTER — Ambulatory Visit
Admission: RE | Admit: 2023-11-11 | Discharge: 2023-11-11 | Disposition: A | Discharge status: Home / Self Care | Payer: BC Managed Care – PPO | Source: Ambulatory Visit | Attending: Obstetrics and Gynecology | Admitting: Obstetrics and Gynecology

## 2023-11-11 DIAGNOSIS — R921 Mammographic calcification found on diagnostic imaging of breast: Secondary | ICD-10-CM

## 2024-01-03 ENCOUNTER — Emergency Department (HOSPITAL_BASED_OUTPATIENT_CLINIC_OR_DEPARTMENT_OTHER): Payer: 59 | Admitting: Radiology

## 2024-01-03 ENCOUNTER — Other Ambulatory Visit: Payer: Self-pay

## 2024-01-03 ENCOUNTER — Encounter (HOSPITAL_BASED_OUTPATIENT_CLINIC_OR_DEPARTMENT_OTHER): Payer: Self-pay | Admitting: Emergency Medicine

## 2024-01-03 ENCOUNTER — Emergency Department (HOSPITAL_BASED_OUTPATIENT_CLINIC_OR_DEPARTMENT_OTHER)
Admission: EM | Admit: 2024-01-03 | Discharge: 2024-01-03 | Disposition: A | Discharge status: Home / Self Care | Payer: 59 | Attending: Emergency Medicine | Admitting: Emergency Medicine

## 2024-01-03 DIAGNOSIS — I10 Essential (primary) hypertension: Secondary | ICD-10-CM | POA: Insufficient documentation

## 2024-01-03 DIAGNOSIS — Z79899 Other long term (current) drug therapy: Secondary | ICD-10-CM | POA: Diagnosis not present

## 2024-01-03 DIAGNOSIS — R0789 Other chest pain: Secondary | ICD-10-CM | POA: Insufficient documentation

## 2024-01-03 DIAGNOSIS — Z853 Personal history of malignant neoplasm of breast: Secondary | ICD-10-CM | POA: Diagnosis not present

## 2024-01-03 LAB — CBC
HCT: 43.1 % (ref 36.0–46.0)
Hemoglobin: 14.4 g/dL (ref 12.0–15.0)
MCH: 31.9 pg (ref 26.0–34.0)
MCHC: 33.4 g/dL (ref 30.0–36.0)
MCV: 95.4 fL (ref 80.0–100.0)
Platelets: 231 10*3/uL (ref 150–400)
RBC: 4.52 MIL/uL (ref 3.87–5.11)
RDW: 12.9 % (ref 11.5–15.5)
WBC: 5.3 10*3/uL (ref 4.0–10.5)
nRBC: 0 % (ref 0.0–0.2)

## 2024-01-03 LAB — BASIC METABOLIC PANEL
Anion gap: 10 (ref 5–15)
BUN: 20 mg/dL (ref 6–20)
CO2: 27 mmol/L (ref 22–32)
Calcium: 9.1 mg/dL (ref 8.9–10.3)
Chloride: 101 mmol/L (ref 98–111)
Creatinine, Ser: 0.69 mg/dL (ref 0.44–1.00)
GFR, Estimated: >60 mL/min (ref >60)
Glucose, Bld: 97 mg/dL (ref 70–99)
Potassium: 3.6 mmol/L (ref 3.5–5.1)
Sodium: 138 mmol/L (ref 135–145)

## 2024-01-03 LAB — TROPONIN I (HIGH SENSITIVITY)
Troponin I (High Sensitivity): 3 ng/L (ref <18)
Troponin I (High Sensitivity): 4 ng/L (ref <18)

## 2024-01-03 NOTE — ED Notes (Signed)
 Discharge paperwork given and verbally understood.

## 2024-01-03 NOTE — Discharge Instructions (Signed)
You have been seen today for your complaint of chest pain. Your lab work was reassuring and showed no abnormalities. Your imaging was reassuring and showed no abnormalities. Home care instructions are as follows:  Check your blood pressure in the morning and evening and write this down to bring to your primary care doctor Follow up with: Your PCP on Friday as scheduled Please seek immediate medical care if you develop any of the following symptoms: Your chest pain gets worse. You have a cough that gets worse, or you cough up blood. You have severe pain in your abdomen. You faint. You have sudden, unexplained chest discomfort. You have sudden, unexplained discomfort in your arms, back, neck, or jaw. You have shortness of breath at any time. You suddenly start to sweat, or your skin gets clammy. You feel nausea or you vomit. You suddenly feel lightheaded or dizzy. You have severe weakness, or unexplained weakness or fatigue. Your heart begins to beat quickly, or it feels like it is skipping beats. At this time there does not appear to be the presence of an emergent medical condition, however there is always the potential for conditions to change. Please read and follow the below instructions.  Do not take your medicine if  develop an itchy rash, swelling in your mouth or lips, or difficulty breathing; call 911 and seek immediate emergency medical attention if this occurs.  You may review your lab tests and imaging results in their entirety on your MyChart account.  Please discuss all results of fully with your primary care provider and other specialist at your follow-up visit.  Note: Portions of this text may have been transcribed using voice recognition software. Every effort was made to ensure accuracy; however, inadvertent computerized transcription errors may still be present.

## 2024-01-03 NOTE — ED Triage Notes (Signed)
C/o left sided CP since yesterday. Lightheaded and dizziness starting today. Hypertensive w/ no hx.

## 2024-01-03 NOTE — ED Provider Notes (Signed)
[place] EMERGENCY DEPARTMENT AT [facility] Provider Note   CSN: [id number] Arrival date & time: 01/03/24  1241     History  Chief Complaint  Patient presents with   Chest Pain    Lori [name] is a 58 y.o. female.  With a history of breast cancer not currently on chemotherapy presenting to the ED for evaluation of chest pain.  Localized to the left sternal border.  States she has had intermittent pain for "a while now.  States it worsened yesterday.  She believes this is stress related.  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  She states she is also under more stress than normal as her daughter is currently living with her and has a broken leg.  She is also raising her grandchild.  Symptoms are intermittent but appear to be exacerbated by stressful situations.  Lasts a few minutes and then resolves.  No associated nausea, vomiting or diaphoresis.  Symptoms are not exertional.  No significant cardiac history.  Pain does not radiate.  It is described as an aching pain.  States it typically goes away with a few deep breaths.  She does have some shortness of breath associated with the chest pain but not at baseline.  She denies any cough, recent cancer treatments, surgeries, long distance travel, unilateral leg swelling, history of DVT or PE, estrogen use.  States she had her blood pressure checked in the school nurses office and it was found to be 180 systolic.  States she is typically normotensive without medication.  She is currently asymptomatic.   Chest Pain Associated symptoms: shortness of breath        Home Medications Prior to Admission medications   Medication Sig Start Date End Date Taking? Authorizing Provider  DULoxetine (CYMBALTA) 30 MG capsule Take 30 mg by mouth daily.    [provider]  estradiol (ESTRACE) 1 MG tablet Take 1 mg by mouth daily. 06/01/16   [provider]  fluticasone (FLONASE) 50  MCG/ACT nasal spray Place 2 sprays into both nostrils daily. 01/08/19   Waldon Merl, PA-C  ibuprofen (ADVIL) 800 MG tablet Take 1 tablet (800 mg total) by mouth every 8 (eight) hours as needed. 11/13/20   Cornett, Maisie Fus, MD  montelukast (SINGULAIR) 10 MG tablet TAKE 1 TABLET BY MOUTH EVERYDAY AT BEDTIME 12/09/20   Sheliah Hatch, MD  ondansetron (ZOFRAN) 4 MG tablet Take 1 tablet (4 mg total) by mouth every 8 (eight) hours as needed for nausea or vomiting. 12/11/20   Sheliah Hatch, MD  oxyCODONE (OXY IR/ROXICODONE) 5 MG immediate release tablet Take 1 tablet (5 mg total) by mouth every 6 (six) hours as needed for severe pain. 11/13/20   Cornett, Maisie Fus, MD  progesterone (PROMETRIUM) 100 MG capsule Take 100 mg by mouth daily.    [provider]      Allergies    Hydrocodone-acetaminophen    Review of Systems   Review of Systems  Respiratory:  Positive for shortness of breath.   Cardiovascular:  Positive for chest pain.  All other systems reviewed and are negative.   Physical Exam Updated Vital Signs BP (!) 155/81 (BP Location: Right Arm)   Pulse (!) 58   Temp 97.8 F (36.6 C)   Resp 18   Ht 5\' 3"  (1.6 m)   Wt 65.8 kg   SpO2 99%   BMI 25.69 kg/m  Physical Exam Vitals and nursing note  reviewed.  Constitutional:      General: She is not in acute distress.    Appearance: Normal appearance. She is normal weight. She is not ill-appearing.  HENT:     Head: Normocephalic and atraumatic.  Cardiovascular:     Rate and Rhythm: Normal rate and regular rhythm.  Pulmonary:     Effort: Pulmonary effort is normal. No respiratory distress.     Breath sounds: No decreased breath sounds, wheezing, rhonchi or rales.  Chest:     Chest wall: No tenderness.  Abdominal:     General: Abdomen is flat.  Musculoskeletal:        General: Normal range of motion.     Cervical back: Neck supple.     Right lower leg: No edema.     Left lower leg: No edema.  Skin:    General:  Skin is warm and dry.  Neurological:     Mental Status: She is alert and oriented to person, place, and time.  Psychiatric:        Mood and Affect: Mood normal.        Behavior: Behavior normal.     ED Results / Procedures / Treatments   Labs (all labs ordered are listed, but only abnormal results are displayed) Labs Reviewed  BASIC METABOLIC PANEL  CBC  TROPONIN I (HIGH SENSITIVITY)  TROPONIN I (HIGH SENSITIVITY)    EKG EKG Interpretation Date/Time:  Tuesday January 03 2024 12:49:14 EST Ventricular Rate:  60 PR Interval:  130 QRS Duration:  80 QT Interval:  408 QTC Calculation: 408 R Axis:   80  Text Interpretation: Normal sinus rhythm Nonspecific ST abnormality Abnormal ECG When compared with ECG of 11-Oct-2007 22:39, Vent. rate has decreased BY  40 BPM T wave inversion no longer evident in Inferior leads Nonspecific T wave abnormality no longer evident in Lateral leads Confirmed by Vonita Moss [phone]) on 01/03/2024 2:01:20 PM  Radiology DG Chest 2 View Result Date: 01/03/2024 CLINICAL DATA:  Chest pain EXAM: CHEST - 2 VIEW COMPARISON:  Chest radiograph 10/11/2007 FINDINGS: The heart size and mediastinal contours are within normal limits. Both lungs are clear. The visualized skeletal structures are unremarkable. IMPRESSION: No active cardiopulmonary disease. Electronically Signed   By: Annia Belt M.D.   On: 01/03/2024 16:17    Procedures Procedures    Medications Ordered in ED Medications - No data to display  ED Course/ Medical Decision Making/ A&P             HEART Score: 3                    Medical Decision Making Amount and/or Complexity of Data Reviewed Labs: ordered. Radiology: ordered.  This patient presents to the ED for concern of chest pain, this involves an extensive number of treatment options, and is a complaint that carries with it a high risk of complications and morbidity.  The differential diagnosis includes The emergent differential  diagnosis of chest pain includes: Acute coronary syndrome, pericarditis, aortic dissection, pulmonary embolism, tension pneumothorax, and esophageal rupture.  I do not believe the patient has an emergent cause of chest pain, other urgent/non-acute considerations include, but are not limited to: chronic angina, aortic stenosis, cardiomyopathy, myocarditis, mitral valve prolapse, pulmonary hypertension, hypertrophic obstructive cardiomyopathy (HOCM), aortic insufficiency, right ventricular hypertrophy, pneumonia, pleuritis, bronchitis, pneumothorax, tumor, gastroesophageal reflux disease (GERD), esophageal spasm, Mallory-Weiss syndrome, peptic ulcer disease, biliary disease, pancreatitis, functional gastrointestinal pain, cervical or thoracic disk disease or arthritis, shoulder  arthritis, costochondritis, subacromial bursitis, anxiety or panic attack, herpes zoster, breast disorders, chest wall tumors, thoracic outlet syndrome, mediastinitis.  My initial workup includes ACS rule out, D-dimer  Additional history obtained from: Nursing notes from this visit. Family husband at bedside provides a portion of the history  I ordered, reviewed and interpreted labs which include: CBC, BMP, troponin, delta troponin.  No leukocytosis or anemia.  Normal electrolytes.  No kidney dysfunction.  Initial troponin negative.  Delta troponin negative.  I ordered imaging studies including chest x-ray I independently visualized and interpreted imaging which showed negative I agree with the radiologist interpretation  Cardiac Monitoring:  The patient was maintained on a cardiac monitor.  I personally viewed and interpreted the cardiac monitored which showed an underlying rhythm of: NSR  Afebrile, initially hypertensive which greatly improved without treatment in the emergency department.  Otherwise hemodynamically stable.  58 year old female presenting to the ED for evaluation of chest pain.  This is intermittent in  nature and typically occurs when she is stressed.  She reports shortness of breath with episodes of chest pain.  She checked her blood pressure and found it to be elevated so she presents to the emergency department.  She is asymptomatic on my assessment and throughout her stay in the emergency department.  EKG improved from previous.  Initial and delta troponin negative.  Chest x-ray negative.  Heart score of 3, low suspicion for ACS.  No significant risk factors for PE.  Patient was offered antihypertensive therapy but declines stating that she has an appointment with her PCP on Friday.  She was encouraged to follow-up and she was encouraged to record her blood pressures.  She was given return precautions.  Stable discharge.  At this time there does not appear to be any evidence of an acute emergency medical condition and the patient appears stable for discharge with appropriate outpatient follow up. Diagnosis was discussed with patient who verbalizes understanding of care plan and is agreeable to discharge. I have discussed return precautions with patient and husband who verbalizes understanding. Patient encouraged to follow-up with their PCP within 1 week. All questions answered.  Patient's case discussed with Dr. Silverio Lay who agrees with plan to discharge with follow-up.   Note: Portions of this report may have been transcribed using voice recognition software. Every effort was made to ensure accuracy; however, inadvertent computerized transcription errors may still be present.        Final Clinical Impression(s) / ED Diagnoses Final diagnoses:  Atypical chest pain    Rx / DC Orders ED Discharge Orders     None         Michelle Piper, Cordelia Poche 01/03/24 1812    Charlynne Pander, MD 01/03/24 2308

## 2024-01-23 LAB — COLOGUARD: COLOGUARD: NEGATIVE

## 2024-09-21 ENCOUNTER — Other Ambulatory Visit (HOSPITAL_COMMUNITY): Payer: Self-pay | Admitting: Internal Medicine

## 2024-09-21 DIAGNOSIS — E782 Mixed hyperlipidemia: Secondary | ICD-10-CM

## 2024-10-10 ENCOUNTER — Ambulatory Visit (HOSPITAL_BASED_OUTPATIENT_CLINIC_OR_DEPARTMENT_OTHER)
Admission: RE | Admit: 2024-10-10 | Discharge: 2024-10-10 | Disposition: A | Discharge status: Home / Self Care | Payer: Self-pay | Source: Ambulatory Visit | Attending: Internal Medicine | Admitting: Internal Medicine

## 2024-10-10 DIAGNOSIS — E782 Mixed hyperlipidemia: Secondary | ICD-10-CM | POA: Insufficient documentation

## 2024-10-22 ENCOUNTER — Other Ambulatory Visit: Payer: Self-pay | Admitting: Obstetrics and Gynecology

## 2024-10-22 DIAGNOSIS — Z1231 Encounter for screening mammogram for malignant neoplasm of breast: Secondary | ICD-10-CM

## 2024-12-04 ENCOUNTER — Ambulatory Visit
Admission: RE | Admit: 2024-12-04 | Discharge: 2024-12-04 | Disposition: A | Source: Ambulatory Visit | Attending: Obstetrics and Gynecology | Admitting: Obstetrics and Gynecology

## 2024-12-04 DIAGNOSIS — Z1231 Encounter for screening mammogram for malignant neoplasm of breast: Secondary | ICD-10-CM

## 2024-12-14 ENCOUNTER — Other Ambulatory Visit: Payer: Self-pay | Admitting: Obstetrics and Gynecology

## 2024-12-14 DIAGNOSIS — R928 Other abnormal and inconclusive findings on diagnostic imaging of breast: Secondary | ICD-10-CM

## 2024-12-25 ENCOUNTER — Encounter
